# Patient Record
Sex: Male | Born: 1980 | Race: Black or African American | Hispanic: No | Marital: Single | Smoking: Current every day smoker
Health system: Southern US, Community
[De-identification: ages and names within clinical notes are randomized; demographics above are authoritative.]

---

## 2004-03-11 ENCOUNTER — Emergency Department (HOSPITAL_COMMUNITY): Admission: EM | Admit: 2004-03-11 | Discharge: 2004-03-11 | Payer: Self-pay | Admitting: Emergency Medicine

## 2005-08-26 ENCOUNTER — Emergency Department (HOSPITAL_COMMUNITY): Admission: EM | Admit: 2005-08-26 | Discharge: 2005-08-26 | Payer: Self-pay | Admitting: Emergency Medicine

## 2005-09-10 IMAGING — CR DG HAND COMPLETE 3+V*L*
4 series · 4 of 4 positions shown · non-contrast
Comparison: none

CLINICAL DATA: Traumatic amputation, soft tissues distal left thumb.  
 LEFT HAND COMPLETE:
 Four views of the left hand are made and show soft tissue amputation of the tip of the left thumb. There is no evidence of fracture or radiopaque foreign body.  The joint spaces appear normal.

[view not recorded (1 of 4)]
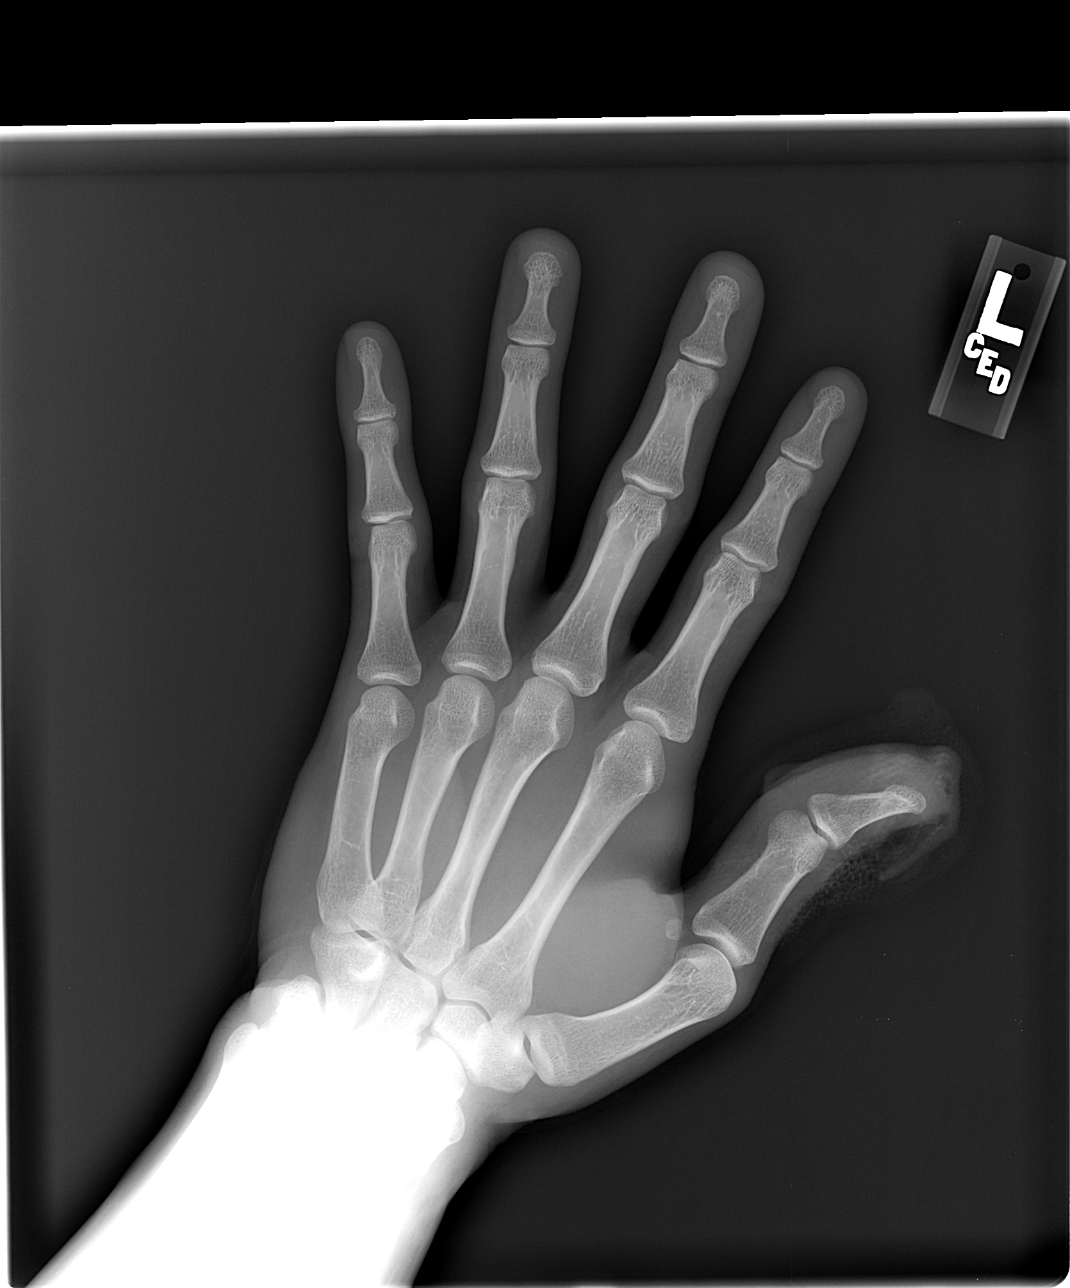

[view not recorded (2 of 4)]
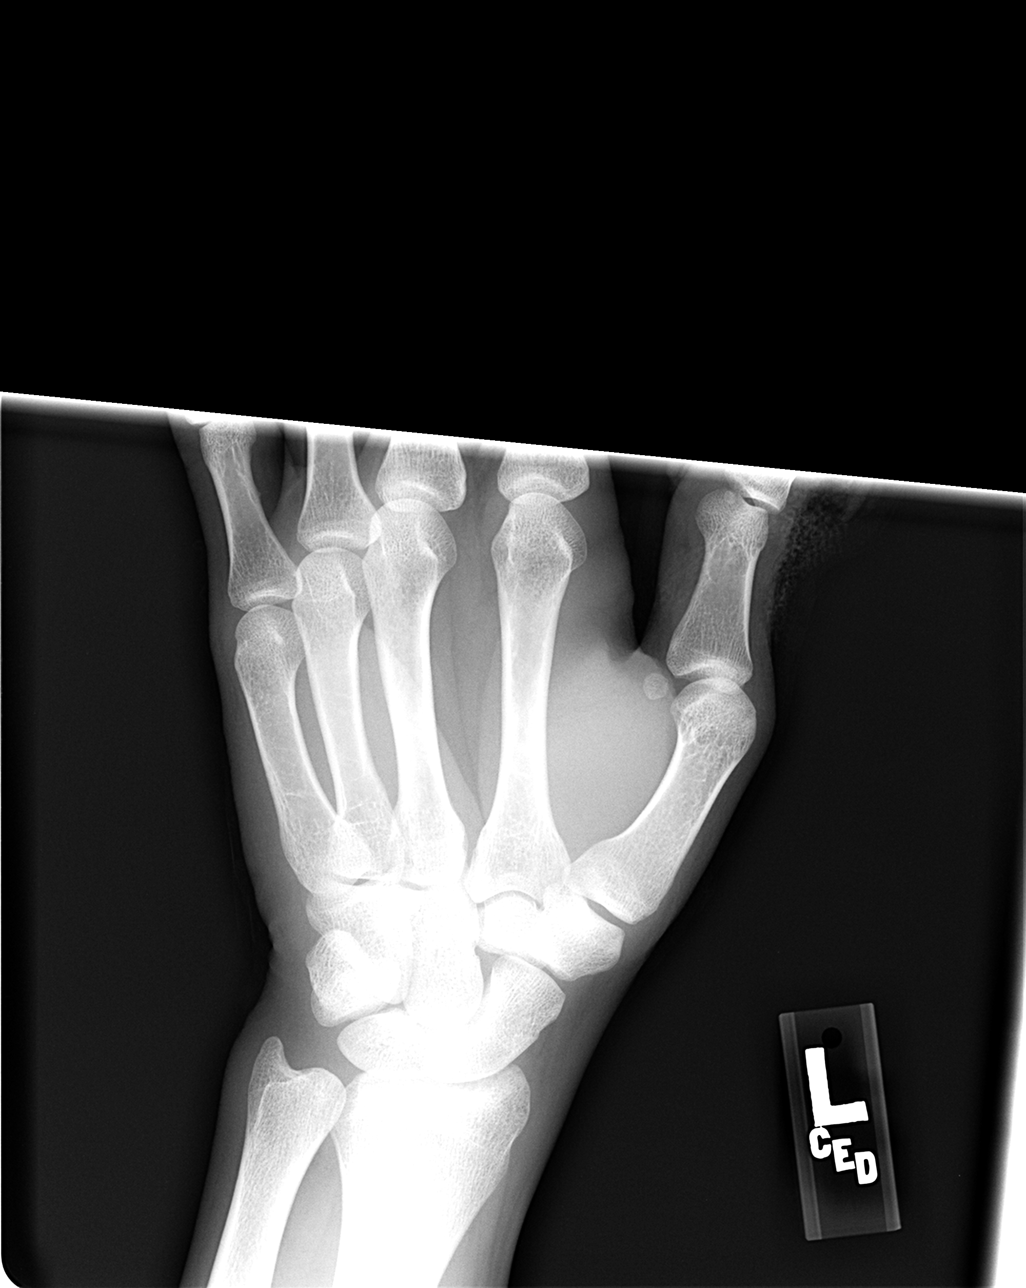

[view not recorded (3 of 4)]
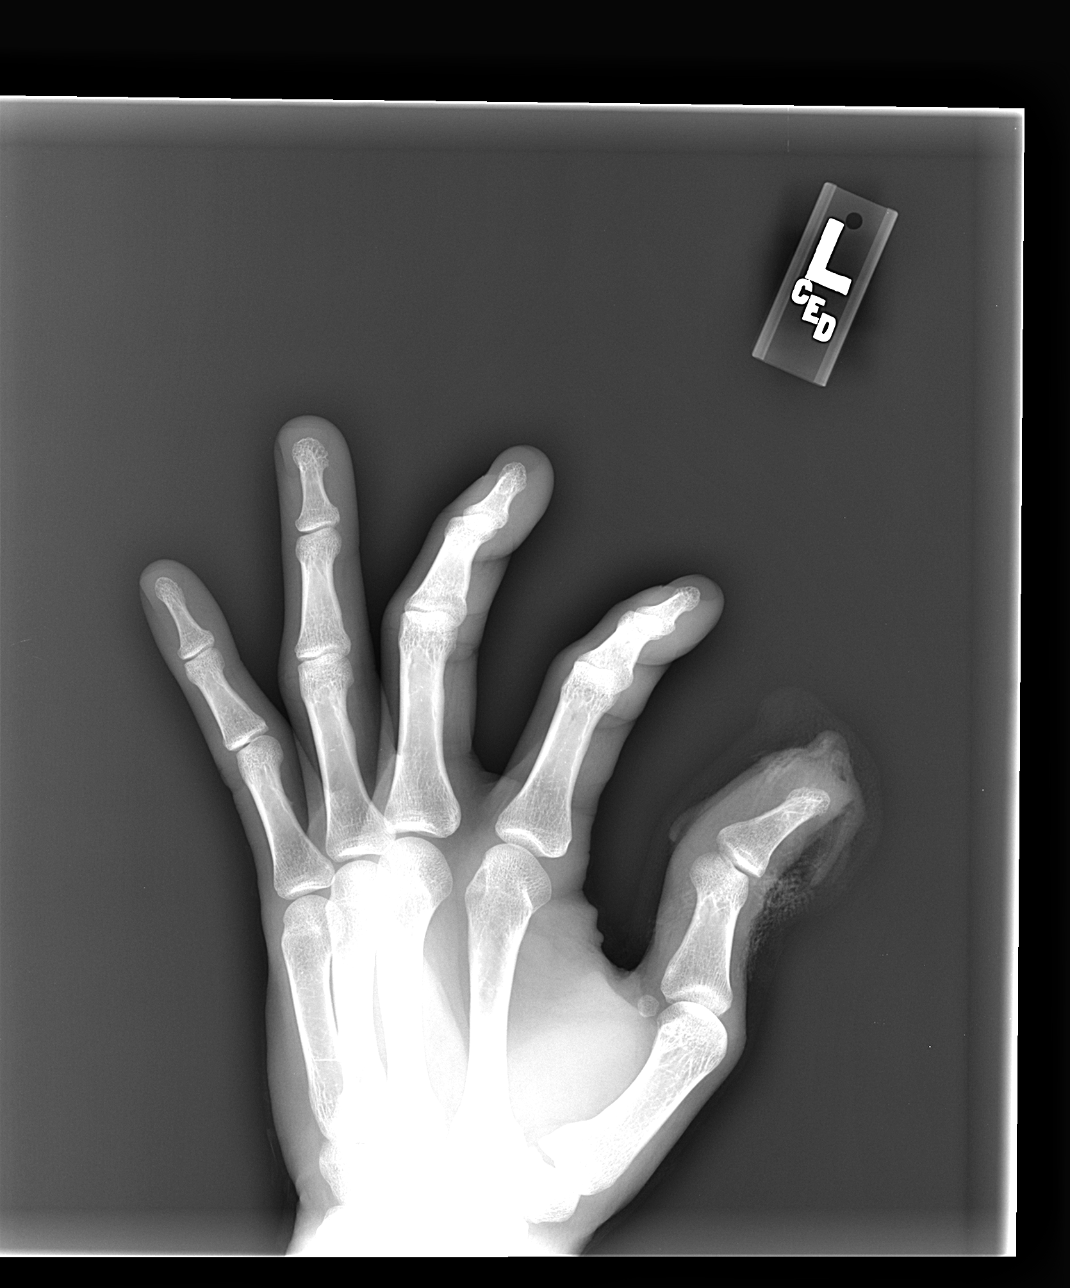

[view not recorded (4 of 4)]
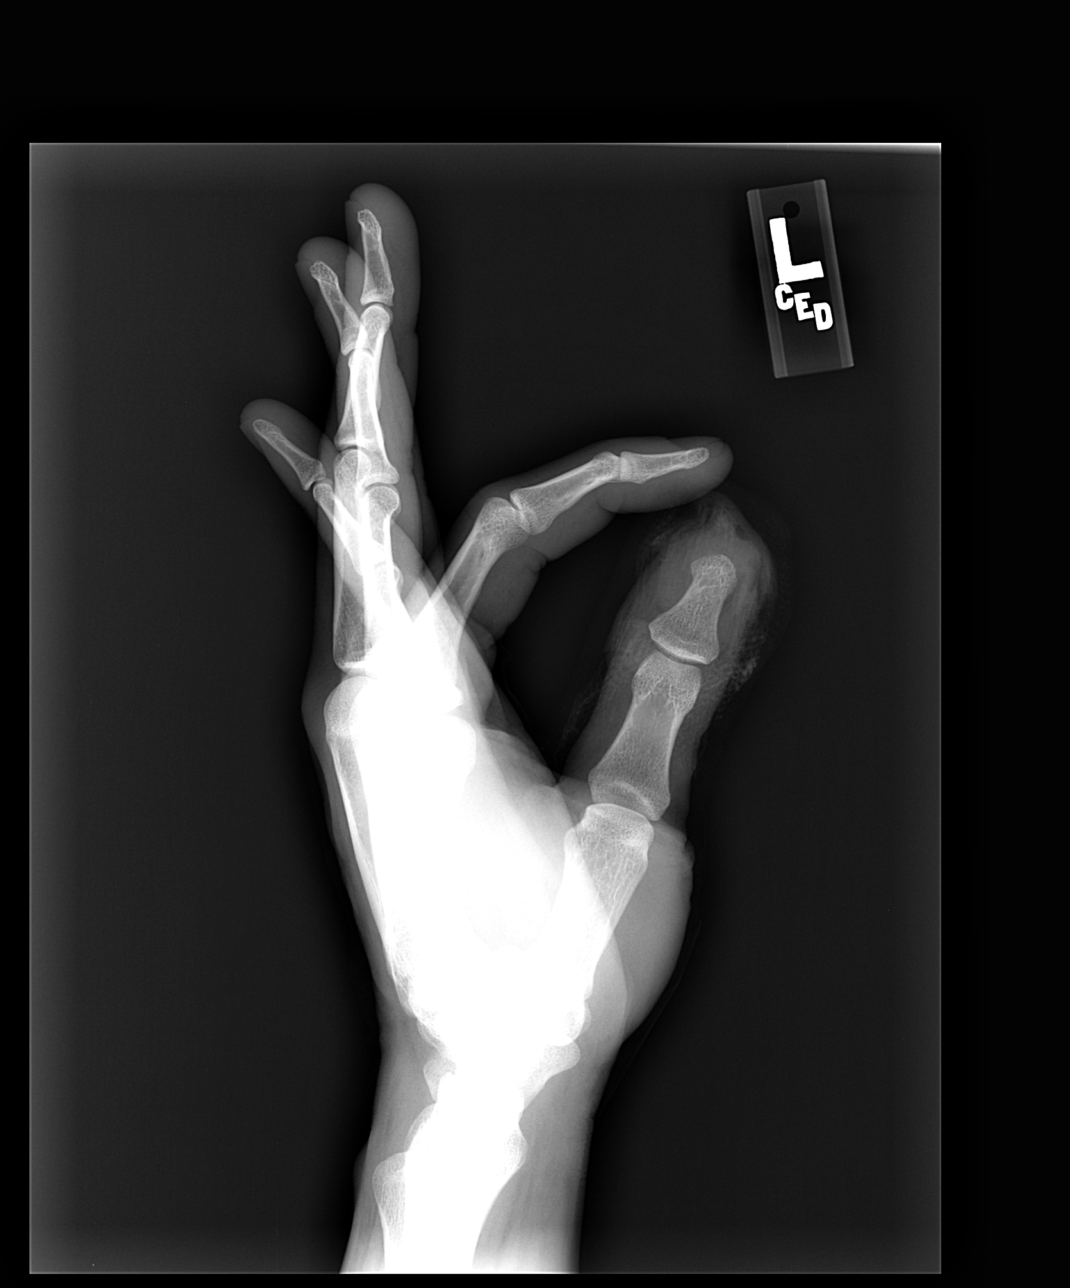

[4 of 4 positions shown; findings below may reference images not displayed]

IMPRESSION: Soft tissue amputation tip left thumb.  No evidence of fracture or radiopaque foreign body. There is a bandage over the left thumb.

## 2006-12-07 ENCOUNTER — Emergency Department (HOSPITAL_COMMUNITY): Admission: EM | Admit: 2006-12-07 | Discharge: 2006-12-07 | Payer: Self-pay | Admitting: Emergency Medicine

## 2008-11-07 ENCOUNTER — Emergency Department (HOSPITAL_COMMUNITY): Admission: EM | Admit: 2008-11-07 | Discharge: 2008-11-07 | Payer: Self-pay | Admitting: Emergency Medicine

## 2012-09-14 ENCOUNTER — Encounter (HOSPITAL_COMMUNITY): Payer: Self-pay | Admitting: Emergency Medicine

## 2012-09-14 ENCOUNTER — Emergency Department (HOSPITAL_COMMUNITY)
Admission: EM | Admit: 2012-09-14 | Discharge: 2012-09-14 | Disposition: A | Discharge status: Home / Self Care | Payer: BC Managed Care – PPO | Attending: Emergency Medicine | Admitting: Emergency Medicine

## 2012-09-14 DIAGNOSIS — F17200 Nicotine dependence, unspecified, uncomplicated: Secondary | ICD-10-CM | POA: Insufficient documentation

## 2012-09-14 DIAGNOSIS — L740 Miliaria rubra: Secondary | ICD-10-CM

## 2012-09-14 DIAGNOSIS — L259 Unspecified contact dermatitis, unspecified cause: Secondary | ICD-10-CM | POA: Insufficient documentation

## 2012-09-14 MED ORDER — TRIAMCINOLONE ACETONIDE 0.5 % EX OINT: TOPICAL_OINTMENT | Freq: Two times a day (BID) | CUTANEOUS | Status: DC

## 2012-09-14 MED ORDER — DEXAMETHASONE SODIUM PHOSPHATE 10 MG/ML IJ SOLN
10.0000 mg | Freq: Once | INTRAMUSCULAR | Status: AC
  Administered 2012-09-14: 10 mg via INTRAMUSCULAR
  Filled 2012-09-14: qty 1

## 2012-09-14 MED ORDER — DIPHENHYDRAMINE HCL 25 MG PO CAPS
25.0000 mg | ORAL_CAPSULE | Freq: Once | ORAL | Status: AC
  Administered 2012-09-14: 25 mg via ORAL
  Filled 2012-09-14: qty 1

## 2012-09-14 NOTE — ED Notes (Signed)
Pt c/o itching rash x 1 week. States has had several times in the past.

## 2012-09-14 NOTE — ED Notes (Signed)
Rash all over x 1 week itchiy  Chest arms legs has had some of this before

## 2012-09-14 NOTE — ED Provider Notes (Signed)
  CSN: [id number]     Arrival date & time 09/14/12  1158 History     First MD Initiated Contact with Patient 09/14/12 1208     Chief Complaint  Patient presents with  . Rash   (Consider location/radiation/quality/duration/timing/severity/associated sxs/prior Treatment) The history is provided by the patient and medical records.   Patient presents to the ED for her diffuse bodily rash x1 week. Patient states he was recently released from prison, hx of similar following prior stent in prison.  Pt states he did ask laundry service what type of detergent they used because he has sensitive skin, staff was unsure.  Since returning home he has used Gain detergent with improvement of the itching.  States after going out into the heat yesterday though, rash worsened.  Pt used topical hydrocortisone cream without relief.  No recent plant or tick exposure.  No fevers, sweats, or chills.  No other contact with environmental allergens or chemicals.  History reviewed. No pertinent past medical history. No past surgical history on file. No family history on file. History  Substance Use Topics  . Smoking status: Current Every Day Smoker  . Smokeless tobacco: Not on file  . Alcohol Use: Yes    Review of Systems  Skin: Positive for rash.  All other systems reviewed and are negative.    Allergies  Review of patient's allergies indicates no known allergies.  Home Medications   Current Outpatient Rx  Name  Route  Sig  Dispense  Refill  . hydrocortisone (CORTIZONE-10) 1 % ointment   Topical   Apply 1 application topically 2 (two) times daily as needed (rash , itching).         Marland Kitchen OVER THE COUNTER MEDICATION   Oral   Take 1 tablet by mouth daily as needed (itching).          BP 128/81  Pulse 108  Temp(Src) 98.4 F (36.9 C)  Resp 16  SpO2 99%  Physical Exam  Nursing note and vitals reviewed. Constitutional: He is oriented to person, place, and time. He appears well-developed and  well-nourished. No distress.  HENT:  Head: Normocephalic and atraumatic.  Mouth/Throat: Oropharynx is clear and moist.  Eyes: Conjunctivae and EOM are normal. Pupils are equal, round, and reactive to light.  Neck: Normal range of motion. Neck supple.  Cardiovascular: Normal rate, regular rhythm and normal heart sounds.   Pulmonary/Chest: Effort normal and breath sounds normal.  Musculoskeletal: Normal range of motion.  Neurological: He is alert and oriented to person, place, and time.  Skin: Skin is warm and dry. Rash noted. No petechiae noted. Rash is papular. He is not diaphoretic.  Diffuse papular rash to BUE and trunk, mildly pruritic; no areas of excoriation; no bug bites identified  Psychiatric: He has a normal mood and affect.    ED Course   Procedures (including critical care time)  Labs Reviewed - No data to display No results found.  1. Contact dermatitis   2. Heat rash     MDM   Rash appears to be mix of contact dermatitis and heat rash.  No identifiable bug bites or petechia.  Decadron and benadryl given.  Rx kenalog.  Instructed he may continue taking OTC benadryl to control itching.  [facility] if sx not resolving in the next few days.  Discussed plan with pt, he agreed.  Return precautions advised.  Garlon Hatchet, PA-C 09/14/12 1300

## 2012-09-14 NOTE — ED Provider Notes (Signed)
Medical screening examination/treatment/procedure(s) were performed by non-physician practitioner and as supervising physician I was immediately available for consultation/collaboration.   Kacyn Souder, MD 09/14/12 1458 

## 2012-09-27 ENCOUNTER — Ambulatory Visit: Payer: BC Managed Care – PPO | Attending: Internal Medicine | Admitting: Internal Medicine

## 2012-09-27 VITALS — BP 130/84 | HR 102 | Temp 98.1°F | Resp 16 | Ht 72.0 in | Wt 247.0 lb

## 2012-09-27 DIAGNOSIS — Z0000 Encounter for general adult medical examination without abnormal findings: Secondary | ICD-10-CM | POA: Insufficient documentation

## 2012-09-27 DIAGNOSIS — R21 Rash and other nonspecific skin eruption: Secondary | ICD-10-CM

## 2012-09-27 NOTE — Progress Notes (Signed)
Pt is here to establish care. Pt. came into day and has expressed that his skin is irritated, discolored and gets worse with heat. These symptoms started 2 years ago. It comes and goes. Some ointments have help but it always seems to come back.

## 2012-09-28 ENCOUNTER — Ambulatory Visit: Payer: BC Managed Care – PPO | Attending: Family Medicine | Admitting: Internal Medicine

## 2012-09-28 VITALS — BP 125/62 | HR 96 | Temp 98.7°F | Resp 18 | Ht 72.0 in | Wt 248.6 lb

## 2012-09-28 DIAGNOSIS — R21 Rash and other nonspecific skin eruption: Secondary | ICD-10-CM | POA: Insufficient documentation

## 2012-09-28 MED ORDER — TRIAMCINOLONE ACETONIDE 0.5 % EX OINT: TOPICAL_OINTMENT | Freq: Two times a day (BID) | CUTANEOUS | Status: DC

## 2012-09-28 NOTE — Progress Notes (Addendum)
Patient Demographics  Billy Ward, is a 32 y.o. male  FAO:[id number]  ONG:[id number]  DOB - Aug 31, 1980  Chief Complaint  Patient presents with  . Rash        Subjective:   Billy Ward today is here to establish primary care.  Currently patient has only complaint is a rash that started on his b/l arms last week, now has involved his torso-back/abdomen/chest. Is exfoliating.Similar rash occurred 2 years back and resolved spontaneously. Denies any new meds, soap, etc. No oral, genital involvement Patient has also has No headache, No chest pain, No abdominal pain,No Nausea, No new weakness tingling or numbness, No Cough or SOB.  Objective:    Filed Vitals:   09/28/12 1302  BP: 125/62  Pulse: 96  Temp: 98.7 F (37.1 C)  Resp: 18  Height: 6' (1.829 m)  Weight: 248 lb 9.6 oz (112.764 kg)  SpO2: 99%   ALLERGIES:  No Known Allergies  PAST MEDICAL HISTORY: No past medical history on file.  PAST SURGICAL HISTORY: No past surgical history on file.  FAMILY HISTORY: No family history of CAD/CVA  MEDICATIONS AT HOME: Prior to Admission medications   Medication Sig Start Date End Date Taking? Authorizing Provider  OVER THE COUNTER MEDICATION Take 1 tablet by mouth daily as needed (itching).   Yes Historical Provider, MD  triamcinolone ointment (KENALOG) 0.5 % Apply topically 2 (two) times daily. 09/28/12  Yes Shanker Levora Dredge, MD  hydrocortisone (CORTIZONE-10) 1 % ointment Apply 1 application topically 2 (two) times daily as needed (rash , itching).    Historical Provider, MD    SOCIAL HISTORY:   reports that he has been smoking.  He does not have any smokeless tobacco history on file. He reports that  drinks alcohol. He reports that he uses illicit drugs (Marijuana) about 5 times per week.  REVIEW OF SYSTEMS:  Constitutional:   No   Fevers, chills, fatigue.  HEENT:    No headaches, Sore throat,   Cardio-vascular: No chest pain,  Orthopnea, swelling in lower  extremities, anasarca, palpitations  GI:  No abdominal pain, nausea, vomiting, diarrhea  Resp: No shortness of breath,  No coughing up of blood.No cough.No wheezing.  Skin:  No mucosal involvement of the rash  GU:  no dysuria, change in color of urine, no urgency or frequency.  No flank pain.  Musculoskeletal: No joint pain or swelling.  No decreased range of motion.  No back pain.  Psych: No change in mood or affect. No depression or anxiety.  No memory loss.   Exam  General appearance :Awake, alert, not in any distress. Speech Clear. Not toxic Looking HEENT: Atraumatic and Normocephalic, pupils equally reactive to light and accomodation Neck: supple, no JVD. No cervical lymphadenopathy.  Chest:Good air entry bilaterally, no added sounds  CVS: S1 S2 regular, no murmurs.  Abdomen: Bowel sounds present, Non tender and not distended with no gaurding, rigidity or rebound. Extremities: B/L Lower Ext shows no edema, both legs are warm to touch Neurology: Awake alert, and oriented X 3, CN II-XII intact, Non focal Skin:exfoliative rash all over the torso-back, chest, abdomen. Also in the hands/arms. No mucosal involvement Wounds:N/A  Data Review   CBC No results found for this basename: WBC, HGB, HCT, PLT, MCV, MCH, MCHC, RDW, NEUTRABS, LYMPHSABS, MONOABS, EOSABS, BASOSABS, BANDABS, BANDSABD,  in the last 168 hours  Chemistries   No results found for this basename: NA, K, CL, CO2, GLUCOSE, BUN, CREATININE, GFRCGP, CALCIUM, MG, AST, ALT, ALKPHOS, BILITOT,  in the last 168 hours ------------------------------------------------------------------------------------------------------------------ No results found for this basename: HGBA1C,  in the last 72 hours ------------------------------------------------------------------------------------------------------------------ No results found for this basename: CHOL, HDL, LDLCALC, TRIG, CHOLHDL, LDLDIRECT,  in the last 72  hours ------------------------------------------------------------------------------------------------------------------ No results found for this basename: TSH, T4TOTAL, FREET3, T3FREE, THYROIDAB,  in the last 72 hours ------------------------------------------------------------------------------------------------------------------ No results found for this basename: VITAMINB12, FOLATE, FERRITIN, TIBC, IRON, RETICCTPCT,  in the last 72 hours  Coagulation profile  No results found for this basename: INR, PROTIME,  in the last 168 hours    Assessment & Plan  Exfoliative Rash -trial of topical triamcinolone  -needs dermatology referral and skin bx  Follow up in 1 week  The patient was given clear instructions to go to ER or return to medical center if symptoms don't improve, worsen or new problems develop. The patient verbalized understanding. The patient was told to call to get lab results if they haven't heard anything in the next week.

## 2012-10-04 NOTE — Progress Notes (Signed)
Subjective:     Patient ID: Billy Ward, male   DOB: [birth year], 32 y.o.   MRN: [id number]  HPI Patient was not seen  Review of Systems  Patient left the clinic before he could be seen    Objective:   Physical Exam     Assessment:     No assessment    Plan:     No plan since patient could not be sent

## 2021-06-16 ENCOUNTER — Emergency Department (HOSPITAL_COMMUNITY)
Admission: EM | Admit: 2021-06-16 | Discharge: 2021-06-17 | Disposition: A | Discharge status: Home / Self Care | Payer: Self-pay | Attending: Student | Admitting: Student

## 2021-06-16 ENCOUNTER — Other Ambulatory Visit: Payer: Self-pay

## 2021-06-16 ENCOUNTER — Encounter (HOSPITAL_COMMUNITY): Payer: Self-pay | Admitting: Emergency Medicine

## 2021-06-16 DIAGNOSIS — R21 Rash and other nonspecific skin eruption: Secondary | ICD-10-CM | POA: Insufficient documentation

## 2021-06-16 DIAGNOSIS — L2089 Other atopic dermatitis: Secondary | ICD-10-CM | POA: Diagnosis not present

## 2021-06-16 DIAGNOSIS — F17210 Nicotine dependence, cigarettes, uncomplicated: Secondary | ICD-10-CM | POA: Diagnosis not present

## 2021-06-16 DIAGNOSIS — L309 Dermatitis, unspecified: Secondary | ICD-10-CM

## 2021-06-16 MED ORDER — TRIAMCINOLONE ACETONIDE 0.1 % EX OINT
TOPICAL_OINTMENT | Freq: Once | CUTANEOUS | Status: DC
  Filled 2021-06-16: qty 15

## 2021-06-16 MED ORDER — TRIAMCINOLONE ACETONIDE 0.1 % EX LOTN: 1.0000 "application " | TOPICAL_LOTION | Freq: Three times a day (TID) | CUTANEOUS | 0 refills | Status: DC

## 2021-06-16 MED ORDER — TRIAMCINOLONE ACETONIDE 0.1 % EX LOTN
TOPICAL_LOTION | Freq: Two times a day (BID) | CUTANEOUS | Status: DC
  Administered 2021-06-16: 1 via TOPICAL
  Filled 2021-06-16: qty 60

## 2021-06-16 NOTE — ED Provider Notes (Signed)
[facility] EMERGENCY DEPARTMENT Provider Note  CSN: [id number] Arrival date & time: 06/16/21 1900  Chief Complaint(s) Eczema  HPI Billy Ward is a 41 y.o. male with PMH eczema who presents emergency department for evaluation of eczema.  He states that he has been using home Eucerin cream without relief.  He states that he has been taking frequent baths but his skin continues to itch.  He does not have a dermatologist.  He denies chest pain, shortness of breath, abdominal pain, nausea, vomiting or other systemic symptoms.   Past Medical History History reviewed. No pertinent past medical history. There are no problems to display for this patient.  Home Medication(s) Prior to Admission medications   Medication Sig Start Date End Date Taking? Authorizing Provider  triamcinolone lotion (KENALOG) 0.1 % Apply 1 application. topically 3 (three) times daily. 06/16/21  Yes Tavi Hoogendoorn, MD  hydrocortisone (CORTIZONE-10) 1 % ointment Apply 1 application topically 2 (two) times daily as needed (rash , itching).    [provider]  OVER THE COUNTER MEDICATION Take 1 tablet by mouth daily as needed (itching).    [provider]  triamcinolone ointment (KENALOG) 0.5 % Apply topically 2 (two) times daily. 09/28/12   Ghimire, Werner Lean, MD                                                                                                                                    Past Surgical History History reviewed. No pertinent surgical history. Family History No family history on file.  Social History Social History   Tobacco Use   Smoking status: Every Day    Packs/day: 1.00    Types: Cigarettes  Substance Use Topics   Alcohol use: Yes   Drug use: Yes    Types: Marijuana, Cocaine   Allergies Patient has no known allergies.  Review of Systems Review of Systems  Skin:  Positive for rash.   Physical Exam Vital Signs  I have reviewed the triage vital  signs BP 116/84   Pulse 86   Temp 98 F (36.7 C) (Oral)   Resp 16   SpO2 99%   Physical Exam Constitutional:      General: He is not in acute distress.    Appearance: Normal appearance.  HENT:     Head: Normocephalic and atraumatic.     Nose: No congestion or rhinorrhea.  Eyes:     General:        Right eye: No discharge.        Left eye: No discharge.     Extraocular Movements: Extraocular movements intact.     Pupils: Pupils are equal, round, and reactive to light.  Cardiovascular:     Rate and Rhythm: Normal rate and regular rhythm.     Heart sounds: No murmur heard. Pulmonary:     Effort: No respiratory distress.     Breath sounds: No wheezing or  rales.  Abdominal:     General: There is no distension.     Tenderness: There is no abdominal tenderness.  Musculoskeletal:        General: Normal range of motion.     Cervical back: Normal range of motion.  Skin:    General: Skin is warm and dry.     Findings: Rash present.  Neurological:     General: No focal deficit present.     Mental Status: He is alert.    ED Results and Treatments Labs (all labs ordered are listed, but only abnormal results are displayed) Labs Reviewed - No data to display                                                                                                                        Radiology No results found.  Pertinent labs & imaging results that were available during my care of the patient were reviewed by me and considered in my medical decision making (see MDM for details).  Medications Ordered in ED Medications  triamcinolone lotion (KENALOG) 0.1 % (1 application. Topical Given 06/16/21 2341)                                                                                                                                     Procedures Procedures  (including critical care time)  Medical Decision Making / ED Course   This patient presents to the ED for concern of rash, this  involves an extensive number of treatment options, and is a complaint that carries with it a high risk of complications and morbidity.  The differential diagnosis includes eczema, contact dermatitis, dry skin  MDM: Seen emergency room for evaluation of eczema.  Physical exam reveals an eczematous rash over the entire trunk, back and to the back of the neck.  Presentation consistent with eczema and education was provided about avoiding frequent showers and the need to keep the skin hydrated.  Kenalog lotion applied to the affected area and patient discharged with a additional prescription for this.  He was given resources to establish outpatient with dermatology and was discharged.   Additional history obtained:  -External records from outside source obtained and reviewed including: Chart review including previous notes, labs, imaging, consultation notes    Medicines ordered and prescription drug management: Meds ordered this encounter  Medications   DISCONTD: triamcinolone ointment (KENALOG) 0.1 %  triamcinolone lotion (KENALOG) 0.1 %   triamcinolone lotion (KENALOG) 0.1 %    Sig: Apply 1 application. topically 3 (three) times daily.    Dispense:  60 mL    Refill:  0    -I have reviewed the patients home medicines and have made adjustments as needed  Critical interventions none    Cardiac Monitoring: The patient was maintained on a cardiac monitor.  I personally viewed and interpreted the cardiac monitored which showed an underlying rhythm of: NSR  Social Determinants of Health:  Factors impacting patients care include: No current dermatologist    Reevaluation: After the interventions noted above, I reevaluated the patient and found that they have :improved  Co morbidities that complicate the patient evaluation History reviewed. No pertinent past medical history.    Dispostion: I considered admission for this patient, but patient's eczema does not meet inpatient criteria  for admission and he is safe for discharge with outpatient follow-up     Final Clinical Impression(s) / ED Diagnoses Final diagnoses:  Eczema, unspecified type     @PCDICTATION @    , MD 06/17/21 [phone]

## 2021-06-16 NOTE — ED Triage Notes (Addendum)
Patient reports worsening eczema with dry skin , rashes and drainage at arms and back for several days unrelived by OTC lotion .

## 2021-06-16 NOTE — ED Provider Triage Note (Signed)
Emergency Medicine Provider Triage Evaluation Note  Billy Ward , a 41 y.o. male  was evaluated in triage.  Pt complains of worsening rash to his back.  History of eczema and feels that this has been flaring up.  Has been using over-the-counter Aquaphor without much improvement.  He feels that the area is "weeping."  States that his eczema has never been this bad in the past.  Review of Systems  Positive: Rash Negative: Fever  Physical Exam  BP (!) 118/94 (BP Location: Right Arm)   Pulse 97   Temp 98.3 F (36.8 C) (Oral)   Resp 20   SpO2 100%  Gen:   Awake, no distress   Resp:  Normal effort  MSK:   Moves extremities without difficulty  Other:  Scaly skin noted to extremities  Medical Decision Making  Medically screening exam initiated at 8:40 PM.  Appropriate orders placed.  Chauncy Mangiaracina was informed that the remainder of the evaluation will be completed by another provider, this initial triage assessment does not replace that evaluation, and the importance of remaining in the ED until their evaluation is complete.  Medication management   Dietrich Pates, Cordelia Poche 06/16/21 2041

## 2022-05-15 ENCOUNTER — Other Ambulatory Visit: Payer: Self-pay

## 2022-05-15 ENCOUNTER — Emergency Department (HOSPITAL_COMMUNITY)
Admission: EM | Admit: 2022-05-15 | Discharge: 2022-05-15 | Discharge status: Against Medical Advice | Payer: BC Managed Care – PPO | Attending: Emergency Medicine | Admitting: Emergency Medicine

## 2022-05-15 DIAGNOSIS — T7840XA Allergy, unspecified, initial encounter: Secondary | ICD-10-CM | POA: Diagnosis not present

## 2022-05-15 DIAGNOSIS — R21 Rash and other nonspecific skin eruption: Secondary | ICD-10-CM | POA: Diagnosis present

## 2022-05-15 DIAGNOSIS — Z5321 Procedure and treatment not carried out due to patient leaving prior to being seen by health care provider: Secondary | ICD-10-CM | POA: Insufficient documentation

## 2022-05-15 NOTE — ED Triage Notes (Signed)
Pt. Stated, I have a whelps and rash on my arms and stomach for 2 days.

## 2022-05-15 NOTE — ED Provider Triage Note (Signed)
Emergency Medicine Provider Triage Evaluation Note  Billy Ward , a 42 y.o. male  was evaluated in triage.  Pt complains of allergic reaction.  He has rash to his face, arms, and torso.  Denies difficulty breathing, swallowing, vision changes, eye swelling.  Denies known allergies.  Tried Benadryl, helped "a little bit" but not significant.    Review of Systems  Positive: As above Negative: As above  Physical Exam  There were no vitals taken for this visit. Gen:   Awake, no distress   Resp:  Normal effort  MSK:   Moves extremities without difficulty  Other:  Papular rash to bilateral arms, torso, and face.   Medical Decision Making  Medically screening exam initiated at 9:59 AM.  Appropriate orders placed.  Billy Ward was informed that the remainder of the evaluation will be completed by another provider, this initial triage assessment does not replace that evaluation, and the importance of remaining in the ED until their evaluation is complete.     Melton Alar R, PA-C 05/15/22 1003

## 2022-05-29 ENCOUNTER — Emergency Department (HOSPITAL_COMMUNITY)
Admission: EM | Admit: 2022-05-29 | Discharge: 2022-05-29 | Disposition: A | Discharge status: Home / Self Care | Payer: BC Managed Care – PPO | Attending: Emergency Medicine | Admitting: Emergency Medicine

## 2022-05-29 ENCOUNTER — Other Ambulatory Visit: Payer: Self-pay

## 2022-05-29 ENCOUNTER — Encounter (HOSPITAL_COMMUNITY): Payer: Self-pay

## 2022-05-29 DIAGNOSIS — H9201 Otalgia, right ear: Secondary | ICD-10-CM | POA: Diagnosis present

## 2022-05-29 DIAGNOSIS — L309 Dermatitis, unspecified: Secondary | ICD-10-CM | POA: Diagnosis not present

## 2022-05-29 MED ORDER — TRIAMCINOLONE ACETONIDE 0.1 % EX CREA: 1.0000 | TOPICAL_CREAM | Freq: Three times a day (TID) | CUTANEOUS | 0 refills | Status: DC

## 2022-05-29 MED ORDER — TRIAMCINOLONE ACETONIDE 0.1 % EX CREA
TOPICAL_CREAM | Freq: Two times a day (BID) | CUTANEOUS | Status: DC
  Filled 2022-05-29: qty 15

## 2022-05-29 NOTE — ED Triage Notes (Signed)
Pt arrived POV w/ c/o R ear pain and decreased hearing to R ear x2 days. Pt states pain is 6/10 when pressure is applied or he is lying down on it. Has taken aspirin for pain with no relief. Pt in NAD

## 2022-05-29 NOTE — ED Notes (Signed)
Reviewed discharge instructions with patient. Follow-up care and medications reviewed. Patient  verbalized understanding. Patient A&Ox4, VSS, and ambulatory with steady gait upon discharge.  °

## 2022-05-29 NOTE — ED Provider Notes (Signed)
[place] EMERGENCY DEPARTMENT AT [facility] Provider Note   CSN: [id number] Arrival date & time: 05/29/22  4098     History  Chief Complaint  Patient presents with   Otalgia    Billy Ward is a 42 y.o. male.  HPI    42 year old male comes in with chief complaint of right ear pain.  Although his primary complaint is ear pain, he wants me to focus on his upper extremity rash.  Patient indicates that for the last 2 days he has been noticing right ear pain.  He thinks that the pain is external.  It is worse with him laying on it.  He also suspects some hearing loss.  He is also having dry skin and itchiness over his bilateral upper extremity and little bit over his torso.  He states that he normally requires an ointment.  He has been applying over-the-counter moisturizing lotion without significant relief.  Home Medications Prior to Admission medications   Medication Sig Start Date End Date Taking? Authorizing Provider  triamcinolone cream (KENALOG) 0.1 % Apply 1 Application topically 3 (three) times daily for 7 days. Use it for 7 to 10 days, until symptoms improve. Do not use it beyond 10 days 05/29/22 06/05/22 Yes Ilijah Doucet, Janey Genta, MD  hydrocortisone (CORTIZONE-10) 1 % ointment Apply 1 application topically 2 (two) times daily as needed (rash , itching).    [provider]  OVER THE COUNTER MEDICATION Take 1 tablet by mouth daily as needed (itching).    [provider]      Allergies    Patient has no known allergies.    Review of Systems   Review of Systems  All other systems reviewed and are negative.   Physical Exam Updated Vital Signs BP 103/62   Pulse 100   Temp 98 F (36.7 C) (Oral)   Resp 18   Ht 5\' 9"  (1.753 m)   Wt 104.3 kg   SpO2 100%   BMI 33.97 kg/m  Physical Exam Vitals and nursing note reviewed.  Constitutional:      Appearance: He is well-developed.  HENT:     Head: Atraumatic.     Right Ear: Tympanic membrane, ear  canal and external ear normal.     Left Ear: Tympanic membrane, ear canal and external ear normal.  Cardiovascular:     Rate and Rhythm: Normal rate.  Pulmonary:     Effort: Pulmonary effort is normal.  Musculoskeletal:     Cervical back: Neck supple.  Skin:    General: Skin is warm.     Comments: Dry, scaly skin noted over bilateral upper extremity and also the abdominal wall  Neurological:     Mental Status: He is alert and oriented to person, place, and time.     ED Results / Procedures / Treatments   Labs (all labs ordered are listed, but only abnormal results are displayed) Labs Reviewed - No data to display  EKG None  Radiology No results found.  Procedures Procedures    Medications Ordered in ED Medications  triamcinolone cream (KENALOG) 0.1 % cream ( Topical Given 05/29/22 1017)    ED Course/ Medical Decision Making/ A&P                             Medical Decision Making Risk Prescription drug management.  42 year old male comes in with chief complaint of ear ache.  Currently he is pain-free.  He does  indicate some hearing loss.  Patient's ear exam is reassuring.  TM looks intact and there is no evidence of debris in the ear canal, no signs of AOM and patient does not have infection of the external ear.  Given the hearing loss, we will advise that he follows up with ENT.  Patient also complains of skin changes.  In the past he has required Kenalog for eczema.  Appears that he is having flareup again.  We will prescribe Kenalog.  I have reviewed patient's previous prescriptions and workup.  Final Clinical Impression(s) / ED Diagnoses Final diagnoses:  Eczema, unspecified type  Right ear pain    Rx / DC Orders ED Discharge Orders          Ordered    triamcinolone cream (KENALOG) 0.1 %  3 times daily        05/29/22 1007              Derwood Kaplan, MD 05/29/22 1024

## 2022-05-29 NOTE — ED Notes (Signed)
Pt requesting more cream and abx shot and asking if he checks back in if he can get more cream.

## 2022-05-29 NOTE — ED Notes (Signed)
Notified pharmacy to send kenalog cream stat d/t pt pending DC

## 2022-05-29 NOTE — Discharge Instructions (Addendum)
Take the eczema cream that is prescribed.  Use it 3 times a day as needed for eczema.  Do not use the cream for more than 10 days.  [facility] at [facility] number provided to ensure that you get optimal care.  We do not see anything abnormal with your ears.  Given that you are complaining of hearing loss, we recommend that you see the ENT doctors for additional audiological evaluation.

## 2022-06-05 ENCOUNTER — Inpatient Hospital Stay (HOSPITAL_COMMUNITY)
Admission: EM | Admit: 2022-06-05 | Discharge: 2022-06-17 | DRG: 872 | Disposition: A | Discharge status: Home / Self Care | Payer: BC Managed Care – PPO | Attending: Internal Medicine | Admitting: Internal Medicine

## 2022-06-05 ENCOUNTER — Emergency Department (HOSPITAL_COMMUNITY): Payer: BC Managed Care – PPO

## 2022-06-05 DIAGNOSIS — R509 Fever, unspecified: Secondary | ICD-10-CM | POA: Diagnosis present

## 2022-06-05 DIAGNOSIS — L03116 Cellulitis of left lower limb: Secondary | ICD-10-CM | POA: Diagnosis present

## 2022-06-05 DIAGNOSIS — L309 Dermatitis, unspecified: Secondary | ICD-10-CM | POA: Insufficient documentation

## 2022-06-05 DIAGNOSIS — H9201 Otalgia, right ear: Secondary | ICD-10-CM | POA: Diagnosis present

## 2022-06-05 DIAGNOSIS — F17210 Nicotine dependence, cigarettes, uncomplicated: Secondary | ICD-10-CM | POA: Diagnosis present

## 2022-06-05 DIAGNOSIS — R7881 Bacteremia: Secondary | ICD-10-CM | POA: Diagnosis not present

## 2022-06-05 DIAGNOSIS — F1410 Cocaine abuse, uncomplicated: Secondary | ICD-10-CM | POA: Diagnosis present

## 2022-06-05 DIAGNOSIS — Z5901 Sheltered homelessness: Secondary | ICD-10-CM

## 2022-06-05 DIAGNOSIS — Z1152 Encounter for screening for COVID-19: Secondary | ICD-10-CM

## 2022-06-05 DIAGNOSIS — F1010 Alcohol abuse, uncomplicated: Secondary | ICD-10-CM | POA: Diagnosis present

## 2022-06-05 DIAGNOSIS — R799 Abnormal finding of blood chemistry, unspecified: Secondary | ICD-10-CM | POA: Insufficient documentation

## 2022-06-05 DIAGNOSIS — R5381 Other malaise: Secondary | ICD-10-CM | POA: Diagnosis present

## 2022-06-05 DIAGNOSIS — A419 Sepsis, unspecified organism: Secondary | ICD-10-CM

## 2022-06-05 DIAGNOSIS — L03119 Cellulitis of unspecified part of limb: Principal | ICD-10-CM

## 2022-06-05 DIAGNOSIS — B9561 Methicillin susceptible Staphylococcus aureus infection as the cause of diseases classified elsewhere: Secondary | ICD-10-CM | POA: Insufficient documentation

## 2022-06-05 DIAGNOSIS — M7989 Other specified soft tissue disorders: Secondary | ICD-10-CM | POA: Diagnosis present

## 2022-06-05 DIAGNOSIS — L03115 Cellulitis of right lower limb: Secondary | ICD-10-CM | POA: Diagnosis present

## 2022-06-05 DIAGNOSIS — N179 Acute kidney failure, unspecified: Secondary | ICD-10-CM

## 2022-06-05 LAB — COMPREHENSIVE METABOLIC PANEL
ALT: 15 U/L (ref 0–44)
AST: 26 U/L (ref 15–41)
Albumin: 2.1 g/dL — ABNORMAL LOW (ref 3.5–5.0)
Alkaline Phosphatase: 54 U/L (ref 38–126)
Anion gap: 9 (ref 5–15)
BUN: 10 mg/dL (ref 6–20)
CO2: 24 mmol/L (ref 22–32)
Calcium: 7.9 mg/dL — ABNORMAL LOW (ref 8.9–10.3)
Chloride: 103 mmol/L (ref 98–111)
Creatinine, Ser: 1.34 mg/dL — ABNORMAL HIGH (ref 0.61–1.24)
GFR, Estimated: >60 mL/min (ref >60)
Glucose, Bld: 107 mg/dL — ABNORMAL HIGH (ref 70–99)
Potassium: 3.9 mmol/L (ref 3.5–5.1)
Sodium: 136 mmol/L (ref 135–145)
Total Bilirubin: 0.5 mg/dL (ref 0.3–1.2)
Total Protein: 5.5 g/dL — ABNORMAL LOW (ref 6.5–8.1)

## 2022-06-05 LAB — CBC
HCT: 41.5 % (ref 39.0–52.0)
Hemoglobin: 12.5 g/dL — ABNORMAL LOW (ref 13.0–17.0)
MCH: 27.2 pg (ref 26.0–34.0)
MCHC: 30.1 g/dL (ref 30.0–36.0)
MCV: 90.2 fL (ref 80.0–100.0)
Platelets: 367 10*3/uL (ref 150–400)
RBC: 4.6 MIL/uL (ref 4.22–5.81)
RDW: 14.6 % (ref 11.5–15.5)
WBC: 9.7 10*3/uL (ref 4.0–10.5)
nRBC: 0 % (ref 0.0–0.2)

## 2022-06-05 LAB — RESP PANEL BY RT-PCR (RSV, FLU A&B, COVID)  RVPGX2
Influenza A by PCR: NEGATIVE
Influenza B by PCR: NEGATIVE
Resp Syncytial Virus by PCR: NEGATIVE
SARS Coronavirus 2 by RT PCR: NEGATIVE

## 2022-06-05 LAB — BRAIN NATRIURETIC PEPTIDE: B Natriuretic Peptide: 57 pg/mL (ref 0.0–100.0)

## 2022-06-05 LAB — LACTIC ACID, PLASMA: Lactic Acid, Venous: 1.3 mmol/L (ref 0.5–1.9)

## 2022-06-05 LAB — SARS CORONAVIRUS 2 BY RT PCR: SARS Coronavirus 2 by RT PCR: NEGATIVE

## 2022-06-05 LAB — ETHANOL: Alcohol, Ethyl (B): <10 mg/dL (ref <10)

## 2022-06-05 MED ORDER — LACTATED RINGERS IV BOLUS
1000.0000 mL | Freq: Once | INTRAVENOUS | Status: AC
  Administered 2022-06-05: 1000 mL via INTRAVENOUS

## 2022-06-05 MED ORDER — SODIUM CHLORIDE 0.9 % IV SOLN
2.0000 g | INTRAVENOUS | Status: DC
  Administered 2022-06-05: 2 g via INTRAVENOUS
  Filled 2022-06-05: qty 20

## 2022-06-05 MED ORDER — ACETAMINOPHEN 500 MG PO TABS
1000.0000 mg | ORAL_TABLET | ORAL | Status: AC
  Administered 2022-06-05: 1000 mg via ORAL
  Filled 2022-06-05: qty 2

## 2022-06-05 NOTE — ED Triage Notes (Addendum)
Pt arrives via POV. Pt has been staying at the [facility]. PT reports chills, cough, body aches, and leg swelling for the past two days. Pt states his eczema has worsened as well. Redness, swelling and warmth noted to bilateral legs.

## 2022-06-05 NOTE — ED Provider Notes (Signed)
[place] EMERGENCY DEPARTMENT AT [facility] Provider Note   CSN: [id number] Arrival date & time: 06/05/22  1806     History {Add pertinent medical, surgical, social history, OB history to HPI:1} Chief Complaint  Patient presents with   Chills   Generalized Body Aches   Leg Swelling    Billy Ward is a 42 y.o. male.  42 year old male with a history of eczema, alcohol use, cocaine use who presents emergency department with fevers.  Patient reports that over the past 6 days has been feeling ill.  Says that he has been having sweats and chills.  Says that he also has been having a diffuse rash which is worsened.  Denies any areas of significant pain.  Not itchy at this time.  Says that it is weeping constantly.  Also has had a mild cough.  Thinks he may be sick from a cigarette that he shared with someone.  Denies any nausea, vomiting, diarrhea, or dysuria.  Also reports several days of worsening leg swelling.  No history of IV drug use.  Has been several days since he last used cocaine.  Says that he drinks 224 ounce beers per day no history of alcohol withdrawals.       Home Medications Prior to Admission medications   Medication Sig Start Date End Date Taking? Authorizing Provider  hydrocortisone (CORTIZONE-10) 1 % ointment Apply 1 application topically 2 (two) times daily as needed (rash , itching).    [provider]  OVER THE COUNTER MEDICATION Take 1 tablet by mouth daily as needed (itching).    [provider]  triamcinolone cream (KENALOG) 0.1 % Apply 1 Application topically 3 (three) times daily for 7 days. Use it for 7 to 10 days, until symptoms improve. Do not use it beyond 10 days 05/29/22 06/05/22  Derwood Kaplan, MD      Allergies    Patient has no known allergies.    Review of Systems   Review of Systems  Physical Exam Updated Vital Signs BP (!) 115/92   Pulse (!) 118   Temp (!) 100.4 F (38 C)   Resp 20   Ht 5\' 9"  (1.753 m)    Wt 104.3 kg   BMI 33.97 kg/m  Physical Exam Vitals and nursing note reviewed.  Constitutional:      General: He is not in acute distress.    Appearance: He is well-developed.  HENT:     Head: Normocephalic and atraumatic.     Right Ear: External ear normal.     Left Ear: External ear normal.     Nose: Nose normal.  Eyes:     Extraocular Movements: Extraocular movements intact.     Conjunctiva/sclera: Conjunctivae normal.     Pupils: Pupils are equal, round, and reactive to light.  Cardiovascular:     Rate and Rhythm: Regular rhythm. Tachycardia present.     Heart sounds: Normal heart sounds.  Pulmonary:     Effort: Pulmonary effort is normal. No respiratory distress.     Breath sounds: Normal breath sounds.  Musculoskeletal:     Cervical back: Normal range of motion and neck supple.     Right lower leg: Edema present.     Left lower leg: Edema present.  Skin:    General: Skin is warm and dry.  Neurological:     Mental Status: He is alert. Mental status is at baseline.  Psychiatric:        Mood and Affect: Mood normal.  Behavior: Behavior normal.     ED Results / Procedures / Treatments   Labs (all labs ordered are listed, but only abnormal results are displayed) Labs Reviewed  CBC - Abnormal; Notable for the following components:      Result Value   Hemoglobin 12.5 (*)    All other components within normal limits  COMPREHENSIVE METABOLIC PANEL - Abnormal; Notable for the following components:   Glucose, Bld 107 (*)    Creatinine, Ser 1.34 (*)    Calcium 7.9 (*)    Total Protein 5.5 (*)    Albumin 2.1 (*)    All other components within normal limits  SARS CORONAVIRUS 2 BY RT PCR    EKG None  Radiology DG Chest 2 View  Result Date: 06/05/2022 CLINICAL DATA:  cough, fever EXAM: CHEST - 2 VIEW COMPARISON:  None Available. FINDINGS: The heart and mediastinal contours are within normal limits. Vague airspace opacity along the mid right lower lung zone may  be related to a nipple shadow. No pulmonary edema. No pleural effusion. No pneumothorax. No acute osseous abnormality. IMPRESSION: Vague airspace opacity along the mid right lower lung zone may be related to a nipple shadow. Recommend repeat frontal view with nipple markers. Electronically Signed   By: Tish Frederickson M.D.   On: 06/05/2022 19:57    Procedures Procedures  {Document cardiac monitor, telemetry assessment procedure when appropriate:1}  Medications Ordered in ED Medications - No data to display  ED Course/ Medical Decision Making/ A&P   {   Click here for ABCD2, HEART and other calculatorsREFRESH Note before signing :1}                          Medical Decision Making Amount and/or Complexity of Data Reviewed Labs: ordered.  Risk OTC drugs.   ***  {Document critical care time when appropriate:1} {Document review of labs and clinical decision tools ie heart score, Chads2Vasc2 etc:1}  {Document your independent review of radiology images, and any outside records:1} {Document your discussion with family members, caretakers, and with consultants:1} {Document social determinants of health affecting pt's care:1} {Document your decision making why or why not admission, treatments were needed:1} Final Clinical Impression(s) / ED Diagnoses Final diagnoses:  None    Rx / DC Orders ED Discharge Orders     None

## 2022-06-05 NOTE — ED Provider Triage Note (Signed)
Emergency Medicine Provider Triage Evaluation Note  Billy Ward , a 42 y.o. male  was evaluated in triage.  Pt complains of cough and bilateral leg swelling. Patient reports that he is staying at a group home and has had recent sick contact. He has had cough and shortness of breath several days ago and since developed night sweats and chills. He reports sleeping on the floor at night and believes this has triggered an eczema flare, which is present on both his arms and legs. Additionally, patient developed bilateral lower extremity swelling 2 days ago. He has never experienced anything like this before.   Review of Systems  Positive: Sweating, chills, cough, shortness of breath, eczema on upper and lower extremities,  lower leg swelling Negative:   Physical Exam  BP (!) 115/92   Pulse (!) 118   Temp (!) 100.4 F (38 C)   Resp 20   Ht 5\' 9"  (1.753 m)   Wt 104.3 kg   BMI 33.97 kg/m  Gen:   Awake, no distress, diaphoretic  Resp:  Normal effort  MSK:   Bilateral lower extremity edema with tenderness to palpation. Moves extremities without difficulty Other:  Atopic dermatitis present on upper and lower extremities, bilateral lower extremity edema  Medical Decision Making  Medically screening exam initiated at 6:59 PM.  Appropriate orders placed.  Billy Ward was informed that the remainder of the evaluation will be completed by another provider, this initial triage assessment does not replace that evaluation, and the importance of remaining in the ED until their evaluation is complete.     Maxwell Marion, PA-C 06/05/22 1920

## 2022-06-06 ENCOUNTER — Other Ambulatory Visit: Payer: Self-pay

## 2022-06-06 ENCOUNTER — Encounter (HOSPITAL_COMMUNITY): Payer: Self-pay | Admitting: Internal Medicine

## 2022-06-06 DIAGNOSIS — L309 Dermatitis, unspecified: Secondary | ICD-10-CM | POA: Insufficient documentation

## 2022-06-06 DIAGNOSIS — B9561 Methicillin susceptible Staphylococcus aureus infection as the cause of diseases classified elsewhere: Secondary | ICD-10-CM | POA: Insufficient documentation

## 2022-06-06 DIAGNOSIS — R7881 Bacteremia: Secondary | ICD-10-CM | POA: Insufficient documentation

## 2022-06-06 DIAGNOSIS — R509 Fever, unspecified: Secondary | ICD-10-CM | POA: Diagnosis present

## 2022-06-06 DIAGNOSIS — N179 Acute kidney failure, unspecified: Secondary | ICD-10-CM

## 2022-06-06 DIAGNOSIS — R799 Abnormal finding of blood chemistry, unspecified: Secondary | ICD-10-CM | POA: Insufficient documentation

## 2022-06-06 LAB — BLOOD CULTURE ID PANEL (REFLEXED) - BCID2

## 2022-06-06 LAB — CBC WITH DIFFERENTIAL/PLATELET
Abs Immature Granulocytes: 0.04 10*3/uL (ref 0.00–0.07)
Basophils Absolute: 0.1 10*3/uL (ref 0.0–0.1)
Basophils Relative: 1 %
Eosinophils Absolute: 1.6 10*3/uL — ABNORMAL HIGH (ref 0.0–0.5)
Eosinophils Relative: 20 %
HCT: 38.5 % — ABNORMAL LOW (ref 39.0–52.0)
Hemoglobin: 11.5 g/dL — ABNORMAL LOW (ref 13.0–17.0)
Immature Granulocytes: 1 %
Lymphocytes Relative: 15 %
Lymphs Abs: 1.2 10*3/uL (ref 0.7–4.0)
MCH: 27.4 pg (ref 26.0–34.0)
MCHC: 29.9 g/dL — ABNORMAL LOW (ref 30.0–36.0)
MCV: 91.9 fL (ref 80.0–100.0)
Monocytes Absolute: 1.8 10*3/uL — ABNORMAL HIGH (ref 0.1–1.0)
Monocytes Relative: 22 %
Neutro Abs: 3.5 10*3/uL (ref 1.7–7.7)
Neutrophils Relative %: 41 %
Platelets: 327 10*3/uL (ref 150–400)
RBC: 4.19 MIL/uL — ABNORMAL LOW (ref 4.22–5.81)
RDW: 14.6 % (ref 11.5–15.5)
WBC: 8.2 10*3/uL (ref 4.0–10.5)
nRBC: 0 % (ref 0.0–0.2)

## 2022-06-06 LAB — PROCALCITONIN: Procalcitonin: <0.1 ng/mL

## 2022-06-06 LAB — LACTIC ACID, PLASMA: Lactic Acid, Venous: 1.3 mmol/L (ref 0.5–1.9)

## 2022-06-06 LAB — BASIC METABOLIC PANEL
Anion gap: 9 (ref 5–15)
BUN: 8 mg/dL (ref 6–20)
CO2: 27 mmol/L (ref 22–32)
Calcium: 7.9 mg/dL — ABNORMAL LOW (ref 8.9–10.3)
Chloride: 102 mmol/L (ref 98–111)
Creatinine, Ser: 1.17 mg/dL (ref 0.61–1.24)
GFR, Estimated: >60 mL/min (ref >60)
Glucose, Bld: 87 mg/dL (ref 70–99)
Potassium: 4.2 mmol/L (ref 3.5–5.1)
Sodium: 138 mmol/L (ref 135–145)

## 2022-06-06 LAB — CULTURE, BLOOD (ROUTINE X 2): Special Requests: ADEQUATE

## 2022-06-06 LAB — HIV ANTIBODY (ROUTINE TESTING W REFLEX): HIV Screen 4th Generation wRfx: NONREACTIVE

## 2022-06-06 LAB — MRSA NEXT GEN BY PCR, NASAL: MRSA by PCR Next Gen: NOT DETECTED

## 2022-06-06 MED ORDER — HYDROCORTISONE 0.5 % EX CREA
TOPICAL_CREAM | Freq: Two times a day (BID) | CUTANEOUS | Status: DC
  Administered 2022-06-06 (×2): 1 via TOPICAL
  Filled 2022-06-06 (×3): qty 28.35

## 2022-06-06 MED ORDER — LACTATED RINGERS IV BOLUS
1000.0000 mL | Freq: Once | INTRAVENOUS | Status: AC
  Administered 2022-06-06: 1000 mL via INTRAVENOUS

## 2022-06-06 MED ORDER — ACETAMINOPHEN 325 MG PO TABS
650.0000 mg | ORAL_TABLET | ORAL | Status: DC | PRN
  Administered 2022-06-06 – 2022-06-08 (×3): 650 mg via ORAL
  Filled 2022-06-06 (×3): qty 2

## 2022-06-06 MED ORDER — TRIAMCINOLONE ACETONIDE 0.1 % EX CREA
TOPICAL_CREAM | Freq: Three times a day (TID) | CUTANEOUS | Status: DC
  Administered 2022-06-11 – 2022-06-16 (×6): 1 via TOPICAL
  Filled 2022-06-06 (×14): qty 15

## 2022-06-06 MED ORDER — SILVER SULFADIAZINE 1 % EX CREA
TOPICAL_CREAM | Freq: Two times a day (BID) | CUTANEOUS | Status: DC
  Administered 2022-06-06: 1 via TOPICAL
  Filled 2022-06-06 (×2): qty 85

## 2022-06-06 MED ORDER — SODIUM CHLORIDE 0.9% FLUSH
3.0000 mL | Freq: Two times a day (BID) | INTRAVENOUS | Status: DC
  Administered 2022-06-06 – 2022-06-17 (×22): 3 mL via INTRAVENOUS

## 2022-06-06 MED ORDER — ACETAMINOPHEN 325 MG PO TABS
650.0000 mg | ORAL_TABLET | Freq: Once | ORAL | Status: AC
  Administered 2022-06-06: 650 mg via ORAL
  Filled 2022-06-06: qty 2

## 2022-06-06 MED ORDER — TRIAMCINOLONE ACETONIDE 0.1 % EX CREA
TOPICAL_CREAM | Freq: Two times a day (BID) | CUTANEOUS | Status: DC
  Administered 2022-06-06 (×2): 1 via TOPICAL
  Filled 2022-06-06: qty 15

## 2022-06-06 MED ORDER — CEFAZOLIN SODIUM-DEXTROSE 2-4 GM/100ML-% IV SOLN
2.0000 g | Freq: Three times a day (TID) | INTRAVENOUS | Status: DC
  Filled 2022-06-06: qty 100

## 2022-06-06 MED ORDER — ACETAMINOPHEN 650 MG RE SUPP: 650.0000 mg | Freq: Four times a day (QID) | RECTAL | Status: DC | PRN

## 2022-06-06 MED ORDER — CEPHALEXIN 500 MG PO CAPS
500.0000 mg | ORAL_CAPSULE | Freq: Two times a day (BID) | ORAL | Status: DC
  Administered 2022-06-06 – 2022-06-07 (×2): 500 mg via ORAL
  Filled 2022-06-06 (×2): qty 1

## 2022-06-06 NOTE — ED Notes (Signed)
Pt given breakfast tray

## 2022-06-06 NOTE — Assessment & Plan Note (Signed)
-   given appearance of skin, high suspicion for contamination in blood cultures - repeat blood cultures now - see fever workup

## 2022-06-06 NOTE — Hospital Course (Signed)
Mr. Sigers is a 42 yo male with PMH eczema who presented with chills, cough, body aches, and leg swelling.  He felt that his eczema has been worsening and was having some flaking of his skin which typically happens. He was seen recently in the ER on 05/29/2022 for right ear pain and was noted to have ongoing skin changes at that time.  He was discharged with a prescription of Kenalog and ear exam was benign and reassuring.  Due to some reported hearing loss at the time, he was recommended to follow-up with ENT. On workup, he was felt to have a possible underlying component of cellulitis associated with his eczema.  He was started on antibiotics and blood cultures were also obtained. Due to the diffuse distribution, initially request for transfer to a tertiary center was pursued.  Upon further observation and monitoring after antibiotics, he appeared to be clinically improved and he was admitted to the hospital for further treatment and monitoring.

## 2022-06-06 NOTE — Progress Notes (Signed)
PHARMACY - PHYSICIAN COMMUNICATION CRITICAL VALUE ALERT - BLOOD CULTURE IDENTIFICATION (BCID)  Billy Ward is an 42 y.o. male who presented to [facility] on 06/05/2022 with a chief complaint of cellulitis  Assessment:  1/4 Bcx: staph aureus   Name of physician (or Provider) Contacted: [place]  Current antibiotics: ceftriaxone 2 g IV  q 24h   Changes to prescribed antibiotics recommended:  Cefazolin 2g IV q8h   No results found for this or any previous visit.  Calton Dach, PharmD Clinical Pharmacist 06/06/2022 2:47 PM

## 2022-06-06 NOTE — ED Notes (Signed)
ED TO INPATIENT HANDOFF REPORT  ED Nurse Name and Phone #: [phone]  S Name/Age/Gender Billy Ward 42 y.o. male Room/Bed: 011C/011C  Code Status   Code Status: Full Code  Home/SNF/Other Home Patient oriented to: self, person, place, time Is this baseline? Yes   Triage Complete: Triage complete  Chief Complaint Fever [R50.9]  Triage Note Pt arrives via POV. Pt has been staying at the [facility]. PT reports chills, cough, body aches, and leg swelling for the past two days. Pt states his eczema has worsened as well. Redness, swelling and warmth noted to bilateral legs.    Allergies No Known Allergies  Level of Care/Admitting Diagnosis ED Disposition     ED Disposition  Admit   Condition  --   Comment  Hospital Area: [facility] [[id number]]  Level of Care: Med-Surg [16]  May place patient in observation at [facility] or [facility] if equivalent level of care is available:: No  Covid Evaluation: Asymptomatic - no recent exposure (last 10 days) testing not required  Diagnosis: Fever [595638]  Admitting Physician: Lewie Chamber [id number]  Attending Physician: Lewie Chamber Mellisa.Dayhoff          B Medical/Surgery History No past medical history on file. No past surgical history on file.   A IV Location/Drains/Wounds Patient Lines/Drains/Airways Status     Active Line/Drains/Airways     None            Intake/Output Last 24 hours No intake or output data in the 24 hours ending 06/06/22 1039  Labs/Imaging Results for orders placed or performed during the hospital encounter of 06/05/22 (from the past 48 hour(s))  CBC     Status: Abnormal   Collection Time: 06/05/22  6:53 PM  Result Value Ref Range   WBC 9.7 4.0 - 10.5 K/uL   RBC 4.60 4.22 - 5.81 MIL/uL   Hemoglobin 12.5 (L) 13.0 - 17.0 g/dL   HCT 33.2 95.1 - 88.4 %   MCV 90.2 80.0 - 100.0 fL   MCH 27.2 26.0 - 34.0 pg   MCHC 30.1 30.0 - 36.0 g/dL   RDW 16.6 06.3 - 01.6 %   Platelets 367 150  - 400 K/uL   nRBC 0.0 0.0 - 0.2 %    Comment: Performed at [facility], [address], [place], [place] [address]  Comprehensive metabolic panel     Status: Abnormal   Collection Time: 06/05/22  6:53 PM  Result Value Ref Range   Sodium 136 135 - 145 mmol/L   Potassium 3.9 3.5 - 5.1 mmol/L   Chloride 103 98 - 111 mmol/L   CO2 24 22 - 32 mmol/L   Glucose, Bld 107 (H) 70 - 99 mg/dL    Comment: Glucose reference range applies only to samples taken after fasting for at least 8 hours.   BUN 10 6 - 20 mg/dL   Creatinine, Ser 2.35 (H) 0.61 - 1.24 mg/dL   Calcium 7.9 (L) 8.9 - 10.3 mg/dL   Total Protein 5.5 (L) 6.5 - 8.1 g/dL   Albumin 2.1 (L) 3.5 - 5.0 g/dL   AST 26 15 - 41 U/L   ALT 15 0 - 44 U/L   Alkaline Phosphatase 54 38 - 126 U/L   Total Bilirubin 0.5 0.3 - 1.2 mg/dL   GFR, Estimated >57 >32 mL/min    Comment: (NOTE) Calculated using the CKD-EPI Creatinine Equation (2021)    Anion gap 9 5 - 15    Comment:  Performed at [facility], [address] [place], [place] [address]  SARS Coronavirus 2 by RT PCR (hospital order, performed in [facility] hospital lab) *cepheid single result test* Anterior Nasal Swab     Status: None   Collection Time: 06/05/22  6:57 PM   Specimen: Anterior Nasal Swab  Result Value Ref Range   SARS Coronavirus 2 by RT PCR NEGATIVE NEGATIVE    Comment: Performed at [facility], [address], [place], [place] [address]  Lactic acid, plasma     Status: None   Collection Time: 06/05/22 10:00 PM  Result Value Ref Range   Lactic Acid, Venous 1.3 0.5 - 1.9 mmol/L    Comment: Performed at [facility], [address], [place], [place] [address]  Brain natriuretic peptide     Status: None   Collection Time: 06/05/22 10:00 PM  Result Value Ref Range   B Natriuretic Peptide 57.0 0.0 - 100.0 pg/mL    Comment: Performed at [facility], [address], [place], [place] [address]  Ethanol     Status: None   Collection Time:  06/05/22 10:27 PM  Result Value Ref Range   Alcohol, Ethyl (B) <10 <10 mg/dL    Comment: (NOTE) Lowest detectable limit for serum alcohol is 10 mg/dL.  For medical purposes only. Performed at [facility], [address] [place], [place] [address]   Resp panel by RT-PCR (RSV, Flu A&B, Covid) Anterior Nasal Swab     Status: None   Collection Time: 06/05/22 10:28 PM   Specimen: Anterior Nasal Swab  Result Value Ref Range   SARS Coronavirus 2 by RT PCR NEGATIVE NEGATIVE   Influenza A by PCR NEGATIVE NEGATIVE   Influenza B by PCR NEGATIVE NEGATIVE    Comment: (NOTE) The Xpert Xpress SARS-CoV-2/FLU/RSV plus assay is intended as an aid in the diagnosis of influenza from Nasopharyngeal swab specimens and should not be used as a sole basis for treatment. Nasal washings and aspirates are unacceptable for Xpert Xpress SARS-CoV-2/FLU/RSV testing.  Fact Sheet for Patients: BloggerCourse.com  Fact Sheet for Healthcare Providers: SeriousBroker.it  This test is not yet approved or cleared by the [place] FDA and has been authorized for detection and/or diagnosis of SARS-CoV-2 by FDA under an Emergency Use Authorization (EUA). This EUA will remain in effect (meaning this test can be used) for the duration of the COVID-19 declaration under Section 564(b)(1) of the Act, 21 U.S.C. section 360bbb-3(b)(1), unless the authorization is terminated or revoked.     Resp Syncytial Virus by PCR NEGATIVE NEGATIVE    Comment: (NOTE) Fact Sheet for Patients: BloggerCourse.com  Fact Sheet for Healthcare Providers: SeriousBroker.it  This test is not yet approved or cleared by the [place] FDA and has been authorized for detection and/or diagnosis of SARS-CoV-2 by FDA under an Emergency Use Authorization (EUA). This EUA will remain in effect (meaning this test can be used) for the  duration of the COVID-19 declaration under Section 564(b)(1) of the Act, 21 U.S.C. section 360bbb-3(b)(1), unless the authorization is terminated or revoked.  Performed at [facility], [address] [place], [place] [address]   HIV Antibody (routine testing w rflx)     Status: None   Collection Time: 06/06/22 12:13 AM  Result Value Ref Range   HIV Screen 4th Generation wRfx Non Reactive Non Reactive    Comment: Performed at [facility], [address] [place],  [place] [address]  Lactic acid, plasma     Status: None   Collection Time: 06/06/22 12:27 AM  Result Value Ref Range   Lactic Acid, Venous 1.3 0.5 - 1.9 mmol/L    Comment: Performed at [facility], [address], [place], [place] [address]  MRSA Next Gen by PCR, Nasal     Status: None   Collection Time: 06/06/22  6:21 AM   Specimen: Nasal Mucosa; Nasal Swab  Result Value Ref Range   MRSA by PCR Next Gen NOT DETECTED NOT DETECTED    Comment: (NOTE) The GeneXpert MRSA Assay (FDA approved for NASAL specimens only), is one component of a comprehensive MRSA colonization surveillance program. It is not intended to diagnose MRSA infection nor to guide or monitor treatment for MRSA infections. Test performance is not FDA approved in patients less than 44 years old. Performed at [facility], [address], [place], [place] [address]    DG Chest 2 View  Result Date: 06/05/2022 CLINICAL DATA:  cough, fever EXAM: CHEST - 2 VIEW COMPARISON:  None Available. FINDINGS: The heart and mediastinal contours are within normal limits. Vague airspace opacity along the mid right lower lung zone may be related to a nipple shadow. No pulmonary edema. No pleural effusion. No pneumothorax. No acute osseous abnormality. IMPRESSION: Vague airspace opacity along the mid right lower lung zone may be related to a nipple shadow. Recommend repeat frontal view with nipple markers. Electronically Signed   By: Tish Frederickson  M.D.   On: 06/05/2022 19:57    Pending Labs Unresulted Labs (From admission, onward)     Start     Ordered   06/05/22 2200  Rapid urine drug screen (hospital performed)  ONCE - STAT,   STAT        06/05/22 2200   06/05/22 2158  Blood Culture (routine x 2)  (Septic presentation on arrival (screening labs, nursing and treatment orders for obvious sepsis))  BLOOD CULTURE X 2,   STAT      06/05/22 2158   06/05/22 2158  Urinalysis, Routine w reflex microscopic -Urine, Clean Catch  (Septic presentation on arrival (screening labs, nursing and treatment orders for obvious sepsis))  ONCE - URGENT,   URGENT       Question:  Specimen Source  Answer:  Urine, Clean Catch   06/05/22 2158            Vitals/Pain Today's Vitals   06/06/22 0631 06/06/22 0800 06/06/22 1000 06/06/22 1002  BP: (!) 111/58 101/78    Pulse: 97 95 100 (!) 101  Resp: 16     Temp: 98 F (36.7 C)   99 F (37.2 C)  TempSrc:    Oral  SpO2: 100% 100% 100% 100%  Weight:      Height:      PainSc:        Isolation Precautions No active isolations  Medications Medications  cefTRIAXone (ROCEPHIN) 2 g in sodium chloride 0.9 % 100 mL IVPB (0 g Intravenous Stopped 06/05/22 2317)  hydrocortisone cream 0.5 % (1 Application Topical Given 06/06/22 0955)  silver sulfADIAZINE (SILVADENE) 1 % cream (1 Application Topical Given 06/06/22 0517)  acetaminophen (TYLENOL) tablet 650 mg (has no administration in time range)  triamcinolone cream (KENALOG) 0.1 % cream (has no administration in time range)  lactated ringers bolus 1,000 mL (0 mLs Intravenous Stopped 06/06/22 0015)  acetaminophen (TYLENOL) tablet 1,000 mg (1,000 mg Oral Given 06/05/22 2231)  lactated ringers bolus  1,000 mL (0 mLs Intravenous Stopped 06/06/22 0135)    Mobility walks      R Recommendations: See Admitting Provider Note  Report given to:   Additional Notes:

## 2022-06-06 NOTE — Assessment & Plan Note (Addendum)
-   Possible component of cellulitis although no specific localization on his body.  He has untreated diffuse eczema and likely poor skin hygiene given social situation (lives in a shelter) - Tmax so far 100.4; no leukocytosis; normal lactic; non-toxic appearing  - started on Rocephin on admission - unclear etiology; would favor de-escalate to keflex and finish short empiric course (changed to Ancef on 5/12 per ID until able to be evaluated) -Remains afebrile with no leukocytosis.  Procalcitonin also negative initially

## 2022-06-06 NOTE — Assessment & Plan Note (Signed)
-   Fairly wide distribution but suspect patient has difficulty maintaining adequate hygiene living and shelter - Patient may shower as needed - Continue triamcinolone and hydrocortisone

## 2022-06-06 NOTE — ED Provider Notes (Addendum)
Patient per overnight ED staff has no accepting doctor.  We have called every hospital in the state that has dermatology and they will not accept the patient in transfer.  I have reengaged with [facility] and they are willing to put him on their wait list but they expect that it might be several days until they would be willing to accept him in transfer.  At this time the hospitalist team is rounding on the patient in the ED.  There is no imminent plan for outside hospital admission.  He will continue to be managed by the hospitalist in the ED.  Overall hospitalist came down to evaluate the patient and they feel comfortable with admission at this time.  This chart was dictated using voice recognition software.  Despite best efforts to proofread,  errors can occur which can change the documentation meaning.    Virgina Norfolk, DO 06/06/22 1610    Virgina Norfolk, DO 06/06/22 1013

## 2022-06-06 NOTE — Assessment & Plan Note (Signed)
-   baseline creatinine ~ u/k (presumed normal) - patient presents with increase in creat >0.3 mg/dL above baseline, creat increase >1.5x baseline presumed to have occurred within past 7 days PTA - suspect some insensible losses and overall dehydration - s/p LR bolus in ER -Renal function normalized with fluids

## 2022-06-06 NOTE — H&P (Signed)
History and Physical    Billy Ward  ZHY:[id number]  DOB: Feb 23, 1980  DOA: 06/05/2022  PCP: Pcp, No Patient coming from: Home  Chief Complaint: fever, aches  HPI:  Billy Ward is a 42 yo male with PMH eczema who presented with chills, cough, body aches, and leg swelling.  He felt that his eczema has been worsening and was having some flaking of his skin which typically happens. He was seen recently in the ER on 05/29/2022 for right ear pain and was noted to have ongoing skin changes at that time.  He was discharged with a prescription of Kenalog and ear exam was benign and reassuring.  Due to some reported hearing loss at the time, he was recommended to follow-up with ENT. On workup, he was felt to have a possible underlying component of cellulitis associated with his eczema.  He was started on antibiotics and blood cultures were also obtained. Due to the diffuse distribution, initially request for transfer to a tertiary [facility] was pursued.  Upon further observation and monitoring after antibiotics, he appeared to be clinically improved and he was admitted to the hospital for further treatment and monitoring.  I have personally briefly reviewed patient's old medical records in [facility] ([place] and discussed patient with the ER provider when appropriate/indicated.  Assessment and Plan: * Fever - Possible component of cellulitis although no specific localization on his body.  He has untreated diffuse eczema and likely poor skin hygiene given social situation (lives in a shelter) - Tmax so far 100.4; no leukocytosis; normal lactic; non-toxic appearing  - started on Rocephin on admission - unclear etiology; would favor de-escalate to keflex and finish short empiric course  - repeat CBC and a procal today  AKI (acute kidney injury) (HCC) - baseline creatinine ~ u/k (presumed normal) - patient presents with increase in creat >0.3 mg/dL above baseline, creat increase >1.5x baseline presumed  to have occurred within past 7 days PTA - suspect some insensible losses and overall dehydration - s/p LR bolus in ER - repeat BMP today   Eczema - Fairly wide distribution but suspect patient has difficulty maintaining adequate hygiene living and shelter - Patient may shower as needed - Continue triamcinolone and hydrocortisone  Contamination of blood culture - given appearance of skin, high suspicion for contamination in blood cultures - repeat blood cultures now - see fever workup   Code Status:     Code Status: Full Code  DVT Prophylaxis: PADUA<4     Anticipated disposition is to: Home  History: History reviewed. No pertinent past medical history.  History reviewed. No pertinent surgical history.   reports that he has been smoking cigarettes. He has been smoking an average of 1 pack per day. He does not have any smokeless tobacco history on file. He reports current alcohol use. He reports current drug use. Drugs: Marijuana and Cocaine.  No Known Allergies  History reviewed. No pertinent family history.  Home Medications: Prior to Admission medications   Not on File    Review of Systems:  Review of Systems  Constitutional:  Positive for chills and fever.  HENT: Negative.    Eyes: Negative.   Respiratory: Negative.    Cardiovascular: Negative.   Gastrointestinal: Negative.   Genitourinary: Negative.   Musculoskeletal: Negative.   Skin:  Positive for itching and rash.  Neurological: Negative.   Endo/Heme/Allergies: Negative.   Psychiatric/Behavioral: Negative.      Physical Exam:  Vitals:   06/06/22 1000 06/06/22 1002 06/06/22 1150  06/06/22 1200  BP:   (!) 140/126 126/80  Pulse: 100 (!) 101 (!) 106 98  Resp:   18   Temp:  99 F (37.2 C) 99.6 F (37.6 C)   TempSrc:  Oral Oral   SpO2: 100% 100% 98%   Weight:      Height:       Physical Exam Constitutional:      General: He is not in acute distress.    Appearance: Normal appearance.  HENT:      Head: Normocephalic and atraumatic.     Mouth/Throat:     Mouth: Mucous membranes are moist.  Eyes:     Extraocular Movements: Extraocular movements intact.  Cardiovascular:     Rate and Rhythm: Normal rate and regular rhythm.  Pulmonary:     Effort: Pulmonary effort is normal. No respiratory distress.     Breath sounds: Normal breath sounds. No wheezing.  Abdominal:     General: Bowel sounds are normal. There is no distension.     Palpations: Abdomen is soft.     Tenderness: There is no abdominal tenderness.  Musculoskeletal:        General: Normal range of motion.     Cervical back: Normal range of motion and neck supple.  Skin:    General: Skin is warm and dry.     Findings: Rash (Mildly pruritic, flaking noted, dry, mild scattered edema in extremities) present. No erythema.  Neurological:     General: No focal deficit present.     Mental Status: He is alert.  Psychiatric:        Mood and Affect: Mood normal.        Behavior: Behavior normal.      Labs on Admission:  I have personally reviewed following labs and imaging studies Results for orders placed or performed during the hospital encounter of 06/05/22 (from the past 24 hour(s))  CBC     Status: Abnormal   Collection Time: 06/05/22  6:53 PM  Result Value Ref Range   WBC 9.7 4.0 - 10.5 K/uL   RBC 4.60 4.22 - 5.81 MIL/uL   Hemoglobin 12.5 (L) 13.0 - 17.0 g/dL   HCT 16.1 09.6 - 04.5 %   MCV 90.2 80.0 - 100.0 fL   MCH 27.2 26.0 - 34.0 pg   MCHC 30.1 30.0 - 36.0 g/dL   RDW 40.9 81.1 - 91.4 %   Platelets 367 150 - 400 K/uL   nRBC 0.0 0.0 - 0.2 %  Comprehensive metabolic panel     Status: Abnormal   Collection Time: 06/05/22  6:53 PM  Result Value Ref Range   Sodium 136 135 - 145 mmol/L   Potassium 3.9 3.5 - 5.1 mmol/L   Chloride 103 98 - 111 mmol/L   CO2 24 22 - 32 mmol/L   Glucose, Bld 107 (H) 70 - 99 mg/dL   BUN 10 6 - 20 mg/dL   Creatinine, Ser 7.82 (H) 0.61 - 1.24 mg/dL   Calcium 7.9 (L) 8.9 - 10.3 mg/dL    Total Protein 5.5 (L) 6.5 - 8.1 g/dL   Albumin 2.1 (L) 3.5 - 5.0 g/dL   AST 26 15 - 41 U/L   ALT 15 0 - 44 U/L   Alkaline Phosphatase 54 38 - 126 U/L   Total Bilirubin 0.5 0.3 - 1.2 mg/dL   GFR, Estimated >95 >62 mL/min   Anion gap 9 5 - 15  SARS Coronavirus 2 by RT PCR (hospital order, performed in  [facility] hospital lab) *cepheid single result test* Anterior Nasal Swab     Status: None   Collection Time: 06/05/22  6:57 PM   Specimen: Anterior Nasal Swab  Result Value Ref Range   SARS Coronavirus 2 by RT PCR NEGATIVE NEGATIVE  Lactic acid, plasma     Status: None   Collection Time: 06/05/22 10:00 PM  Result Value Ref Range   Lactic Acid, Venous 1.3 0.5 - 1.9 mmol/L  Blood Culture (routine x 2)     Status: None (Preliminary result)   Collection Time: 06/05/22 10:00 PM   Specimen: BLOOD  Result Value Ref Range   Specimen Description BLOOD SITE NOT SPECIFIED    Special Requests      BOTTLES DRAWN AEROBIC AND ANAEROBIC Blood Culture adequate volume   Culture      NO GROWTH < 24 HOURS Performed at [facility], [address] [place], [place] [address]    Report Status PENDING   Brain natriuretic peptide     Status: None   Collection Time: 06/05/22 10:00 PM  Result Value Ref Range   B Natriuretic Peptide 57.0 0.0 - 100.0 pg/mL  Ethanol     Status: None   Collection Time: 06/05/22 10:27 PM  Result Value Ref Range   Alcohol, Ethyl (B) <10 <10 mg/dL  Resp panel by RT-PCR (RSV, Flu A&B, Covid) Anterior Nasal Swab     Status: None   Collection Time: 06/05/22 10:28 PM   Specimen: Anterior Nasal Swab  Result Value Ref Range   SARS Coronavirus 2 by RT PCR NEGATIVE NEGATIVE   Influenza A by PCR NEGATIVE NEGATIVE   Influenza B by PCR NEGATIVE NEGATIVE   Resp Syncytial Virus by PCR NEGATIVE NEGATIVE  Blood Culture (routine x 2)     Status: None (Preliminary result)   Collection Time: 06/05/22 10:29 PM   Specimen: BLOOD  Result Value Ref Range   Specimen Description BLOOD  SITE NOT SPECIFIED    Special Requests      BOTTLES DRAWN AEROBIC AND ANAEROBIC Blood Culture adequate volume   Culture  Setup Time      GRAM POSITIVE COCCI AEROBIC BOTTLE ONLY CRITICAL RESULT CALLED TO, READ BACK BY AND VERIFIED WITH: [facility] MADELINE MITCHELL [id number] AT 1446 BY EC Performed at [facility], [address] [place], [place] [address]    Culture GRAM POSITIVE COCCI    Report Status PENDING   Blood Culture ID Panel (Reflexed)     Status: Abnormal   Collection Time: 06/05/22 10:29 PM  Result Value Ref Range   Enterococcus faecalis NOT DETECTED NOT DETECTED   Enterococcus Faecium NOT DETECTED NOT DETECTED   Listeria monocytogenes NOT DETECTED NOT DETECTED   Staphylococcus species DETECTED (A) NOT DETECTED   Staphylococcus aureus (BCID) DETECTED (A) NOT DETECTED   Staphylococcus epidermidis NOT DETECTED NOT DETECTED   Staphylococcus lugdunensis NOT DETECTED NOT DETECTED   Streptococcus species NOT DETECTED NOT DETECTED   Streptococcus agalactiae NOT DETECTED NOT DETECTED   Streptococcus pneumoniae NOT DETECTED NOT DETECTED   Streptococcus pyogenes NOT DETECTED NOT DETECTED   A.calcoaceticus-baumannii NOT DETECTED NOT DETECTED   Bacteroides fragilis NOT DETECTED NOT DETECTED   Enterobacterales NOT DETECTED NOT DETECTED   Enterobacter cloacae complex NOT DETECTED NOT DETECTED   Escherichia coli NOT DETECTED NOT DETECTED   Klebsiella aerogenes NOT DETECTED NOT DETECTED   Klebsiella oxytoca NOT DETECTED NOT DETECTED   Klebsiella pneumoniae NOT DETECTED NOT DETECTED   Proteus species NOT  DETECTED NOT DETECTED   Salmonella species NOT DETECTED NOT DETECTED   Serratia marcescens NOT DETECTED NOT DETECTED   Haemophilus influenzae NOT DETECTED NOT DETECTED   Neisseria meningitidis NOT DETECTED NOT DETECTED   Pseudomonas aeruginosa NOT DETECTED NOT DETECTED   Stenotrophomonas maltophilia NOT DETECTED NOT DETECTED   Candida albicans NOT DETECTED NOT DETECTED   Candida  auris NOT DETECTED NOT DETECTED   Candida glabrata NOT DETECTED NOT DETECTED   Candida krusei NOT DETECTED NOT DETECTED   Candida parapsilosis NOT DETECTED NOT DETECTED   Candida tropicalis NOT DETECTED NOT DETECTED   Cryptococcus neoformans/gattii NOT DETECTED NOT DETECTED   Meth resistant mecA/C and MREJ NOT DETECTED NOT DETECTED  HIV Antibody (routine testing w rflx)     Status: None   Collection Time: 06/06/22 12:13 AM  Result Value Ref Range   HIV Screen 4th Generation wRfx Non Reactive Non Reactive  Lactic acid, plasma     Status: None   Collection Time: 06/06/22 12:27 AM  Result Value Ref Range   Lactic Acid, Venous 1.3 0.5 - 1.9 mmol/L  MRSA Next Gen by PCR, Nasal     Status: None   Collection Time: 06/06/22  6:21 AM   Specimen: Nasal Mucosa; Nasal Swab  Result Value Ref Range   MRSA by PCR Next Gen NOT DETECTED NOT DETECTED     Radiological Exams on Admission: DG Chest 2 View  Result Date: 06/05/2022 CLINICAL DATA:  cough, fever EXAM: CHEST - 2 VIEW COMPARISON:  None Available. FINDINGS: The heart and mediastinal contours are within normal limits. Vague airspace opacity along the mid right lower lung zone may be related to a nipple shadow. No pulmonary edema. No pleural effusion. No pneumothorax. No acute osseous abnormality. IMPRESSION: Vague airspace opacity along the mid right lower lung zone may be related to a nipple shadow. Recommend repeat frontal view with nipple markers. Electronically Signed   By: Tish Frederickson M.D.   On: 06/05/2022 19:57   DG Chest 2 View  Final Result       Lewie Chamber, MD [facility] 06/06/2022, 3:17 PM

## 2022-06-06 NOTE — Consult Note (Signed)
Initial Consultation Note   Patient: Billy Ward ZOX:[id number] DOB: 11-11-1980 PCP: Pcp, No DOA: 06/05/2022 DOS: the patient was seen and examined on 06/06/2022 Primary service: Default, Provider, MD  Referring physician: Dr. Eloise Harman, EDP Reason for consult: Diffuse rash with concern for cellulitis  Assessment/Plan: Diffuse unspecified rash, possibly severe eczema with cellulitis, POA History of eczema HIV screening test ordered due to suspected new onset severe eczema, it was nonreactive on 06/06/2022 Will benefit from dermatology evaluation, EDP working on transfer to tertiary center with dermatology service. Continue IV antibiotics, currently on Rocephin 2 g x 7 days Follow MRSA screening test, cover for MRSA if positive Follow peripheral blood cultures  Bilateral lower extremity edema, suspect from hypoalbuminemia Albumin 2.1, total protein 5.5 Elevate lower extremities  Polysubstance abuse including cocaine and alcohol No signs of withdrawal at the time of this visit.   Thank you for involving [place] in the care of this patient.  Will continue to follow the patient along with you.  HPI: Billy Ward is a 42 y.o. male with past medical history of eczema, cocaine abuse, who presented to [facility] ED with complaints of a diffuse rash, pruritic, and draining serous fluid.  Symptoms started last week and are worsening.  Associated with recurrent chills and subjective fevers.  Also endorses edema in his lower extremities bilaterally.  Upon evaluation, the patient's sheet and bed covers are soaked with draining fluid from the rash.  The rash is diffused with majority of body involvement.  EDP has discussed the case with [facility] dermatology and their recommendations have been implemented by EDP.  Currently on Rocephin x 7 for cellulitis.  MRSA screening test has been ordered and is pending.  Peripheral blood cultures have also been ordered and are in process.  Due to dermatology service  currently not being available at [facility], EDP initiated transfer request to [facility], [facility], [facility], and [facility].  Awaiting bed availability.  Review of Systems: As mentioned in the history of present illness. All other systems reviewed and are negative. No past medical history on file. No past surgical history on file. Social History:  reports that he has been smoking cigarettes. He has been smoking an average of 1 pack per day. He does not have any smokeless tobacco history on file. He reports current alcohol use. He reports current drug use. Drugs: Marijuana and Cocaine.  No Known Allergies  Family history: None reported    Physical Exam: Vitals:   06/06/22 0215 06/06/22 0241 06/06/22 0330 06/06/22 0405  BP:  (!) 92/54 (!) 95/54 100/63  Pulse: 92 95 93 92  Resp:  16 16 16   Temp:      TempSrc:      SpO2: 100% 100% 100% 100%  Weight:      Height:      Well-developed well-nourished in no acute distress.  Alert and oriented x 3. Diffuse rash affecting majority of the body Bilateral lower extremity edema  Data Reviewed:  Reviewed vital signs, labs, and imaging.  Family Communication: None at bedside. Primary team communication: Discussed with Dr. Eloise Harman, EDP. Thank you very much for involving [place] in the care of your patient.  Author: Darlin Drop, DO 06/06/2022 5:13 AM  For on call review [email or web address].

## 2022-06-07 DIAGNOSIS — R509 Fever, unspecified: Secondary | ICD-10-CM | POA: Diagnosis present

## 2022-06-07 DIAGNOSIS — N179 Acute kidney failure, unspecified: Secondary | ICD-10-CM | POA: Diagnosis present

## 2022-06-07 DIAGNOSIS — R5381 Other malaise: Secondary | ICD-10-CM | POA: Diagnosis present

## 2022-06-07 DIAGNOSIS — L03116 Cellulitis of left lower limb: Secondary | ICD-10-CM | POA: Diagnosis present

## 2022-06-07 DIAGNOSIS — L03818 Cellulitis of other sites: Secondary | ICD-10-CM

## 2022-06-07 DIAGNOSIS — L03115 Cellulitis of right lower limb: Secondary | ICD-10-CM | POA: Diagnosis present

## 2022-06-07 DIAGNOSIS — L309 Dermatitis, unspecified: Secondary | ICD-10-CM | POA: Diagnosis present

## 2022-06-07 DIAGNOSIS — R7881 Bacteremia: Secondary | ICD-10-CM | POA: Diagnosis present

## 2022-06-07 DIAGNOSIS — H9201 Otalgia, right ear: Secondary | ICD-10-CM | POA: Diagnosis present

## 2022-06-07 DIAGNOSIS — M7989 Other specified soft tissue disorders: Secondary | ICD-10-CM | POA: Diagnosis present

## 2022-06-07 DIAGNOSIS — F17210 Nicotine dependence, cigarettes, uncomplicated: Secondary | ICD-10-CM | POA: Diagnosis present

## 2022-06-07 DIAGNOSIS — Z5901 Sheltered homelessness: Secondary | ICD-10-CM | POA: Diagnosis not present

## 2022-06-07 DIAGNOSIS — F1410 Cocaine abuse, uncomplicated: Secondary | ICD-10-CM | POA: Diagnosis present

## 2022-06-07 DIAGNOSIS — F1010 Alcohol abuse, uncomplicated: Secondary | ICD-10-CM | POA: Diagnosis present

## 2022-06-07 DIAGNOSIS — Z1152 Encounter for screening for COVID-19: Secondary | ICD-10-CM | POA: Diagnosis not present

## 2022-06-07 DIAGNOSIS — B9561 Methicillin susceptible Staphylococcus aureus infection as the cause of diseases classified elsewhere: Secondary | ICD-10-CM | POA: Diagnosis present

## 2022-06-07 LAB — CBC WITH DIFFERENTIAL/PLATELET
Abs Immature Granulocytes: 0.04 10*3/uL (ref 0.00–0.07)
Basophils Absolute: 0 10*3/uL (ref 0.0–0.1)
Basophils Relative: 0 %
Eosinophils Absolute: 1.6 10*3/uL — ABNORMAL HIGH (ref 0.0–0.5)
Eosinophils Relative: 21 %
HCT: 35.2 % — ABNORMAL LOW (ref 39.0–52.0)
Hemoglobin: 10.6 g/dL — ABNORMAL LOW (ref 13.0–17.0)
Immature Granulocytes: 1 %
Lymphocytes Relative: 17 %
Lymphs Abs: 1.4 10*3/uL (ref 0.7–4.0)
MCH: 27 pg (ref 26.0–34.0)
MCHC: 30.1 g/dL (ref 30.0–36.0)
MCV: 89.8 fL (ref 80.0–100.0)
Monocytes Absolute: 1.8 10*3/uL — ABNORMAL HIGH (ref 0.1–1.0)
Monocytes Relative: 23 %
Neutro Abs: 3.1 10*3/uL (ref 1.7–7.7)
Neutrophils Relative %: 38 %
Platelets: 331 10*3/uL (ref 150–400)
RBC: 3.92 MIL/uL — ABNORMAL LOW (ref 4.22–5.81)
RDW: 14.5 % (ref 11.5–15.5)
WBC: 8 10*3/uL (ref 4.0–10.5)
nRBC: 0 % (ref 0.0–0.2)

## 2022-06-07 LAB — BASIC METABOLIC PANEL
Anion gap: 7 (ref 5–15)
BUN: 12 mg/dL (ref 6–20)
CO2: 26 mmol/L (ref 22–32)
Calcium: 7.9 mg/dL — ABNORMAL LOW (ref 8.9–10.3)
Chloride: 104 mmol/L (ref 98–111)
Creatinine, Ser: 1.13 mg/dL (ref 0.61–1.24)
GFR, Estimated: >60 mL/min (ref >60)
Glucose, Bld: 112 mg/dL — ABNORMAL HIGH (ref 70–99)
Potassium: 3.9 mmol/L (ref 3.5–5.1)
Sodium: 137 mmol/L (ref 135–145)

## 2022-06-07 LAB — CULTURE, BLOOD (ROUTINE X 2)

## 2022-06-07 LAB — MAGNESIUM: Magnesium: 1.9 mg/dL (ref 1.7–2.4)

## 2022-06-07 MED ORDER — LACTATED RINGERS IV BOLUS
1000.0000 mL | Freq: Once | INTRAVENOUS | Status: AC
  Administered 2022-06-07: 1000 mL via INTRAVENOUS

## 2022-06-07 MED ORDER — CEFAZOLIN SODIUM-DEXTROSE 2-4 GM/100ML-% IV SOLN
2.0000 g | Freq: Three times a day (TID) | INTRAVENOUS | Status: DC
  Administered 2022-06-07 – 2022-06-17 (×29): 2 g via INTRAVENOUS
  Filled 2022-06-07 (×30): qty 100

## 2022-06-07 NOTE — Plan of Care (Signed)
A/ox4 and on room air. No complaints of pain this shift. Patient self applies skin creams as scheduled. Self care. No overnight issues.    Problem: Education: Goal: Knowledge of General Education information will improve Description: Including pain rating scale, medication(s)/side effects and non-pharmacologic comfort measures Outcome: Progressing   Problem: Health Behavior/Discharge Planning: Goal: Ability to manage health-related needs will improve Outcome: Progressing   Problem: Clinical Measurements: Goal: Ability to maintain clinical measurements within normal limits will improve Outcome: Progressing Goal: Will remain free from infection Outcome: Progressing Goal: Diagnostic test results will improve Outcome: Progressing Goal: Respiratory complications will improve Outcome: Progressing Goal: Cardiovascular complication will be avoided Outcome: Progressing   Problem: Activity: Goal: Risk for activity intolerance will decrease Outcome: Progressing   Problem: Nutrition: Goal: Adequate nutrition will be maintained Outcome: Progressing   Problem: Coping: Goal: Level of anxiety will decrease Outcome: Progressing   Problem: Elimination: Goal: Will not experience complications related to bowel motility Outcome: Progressing Goal: Will not experience complications related to urinary retention Outcome: Progressing   Problem: Pain Managment: Goal: General experience of comfort will improve Outcome: Progressing   Problem: Safety: Goal: Ability to remain free from injury will improve Outcome: Progressing   Problem: Skin Integrity: Goal: Risk for impaired skin integrity will decrease Outcome: Progressing

## 2022-06-07 NOTE — Consult Note (Signed)
[facility]  Total days of antibiotics 2 Reason for Consult:MSSA bacteremia    Referring Physician: girguis/autoconsult  Principal Problem:   Fever Active Problems:   Contamination of blood culture   Eczema   AKI (acute kidney injury) (HCC)    HPI: Casimier Dumitrescu is a 42 y.o. male with alcohol dependence, eczema, and AKI, admitted on 5/10 for hx of feeling poorly x 1 week, in addition to nightsweats and chills. He mentioned that he is having diffuse worsening rash. He reports that his rash is intermittent sometimes worsened by chemical/detergent. He does not use any emoliants routinely.  He states the last time he had cocaine was a few days ago but mostly is drinks alcohol day. No IVDU. He was started on ceftriaxone for cellulitis by the ED. On admit, his wbc was 9.7 with fever of 100.25F. he was changed to cephalexin yesterday with possibility that blood cx were contaminant given only 1 of 4 bottles and has eczema involvement to whole body.  History reviewed. No pertinent past medical history.  Allergies: No Known Allergies  MEDICATIONS:  hydrocortisone cream   Topical BID   sodium chloride flush  3 mL Intravenous Q12H   triamcinolone cream   Topical TID    Social History   Tobacco Use   Smoking status: Every Day    Packs/day: 1    Types: Cigarettes  Substance Use Topics   Alcohol use: Yes    Comment: occasional   Drug use: Yes    Types: Marijuana, Cocaine    Comment: marijuana former user    History reviewed. No pertinent family history.  Review of Systems  Constitutional: Negative for fever, chills, diaphoresis, activity change, appetite change, fatigue and unexpected weight change.  HENT: Negative for congestion, sore throat, rhinorrhea, sneezing, trouble swallowing and sinus pressure.  Eyes: Negative for photophobia and visual disturbance.  Respiratory: Negative for cough, chest tightness, shortness of breath, wheezing and stridor.   Cardiovascular: Negative for chest pain, palpitations and leg swelling.  Gastrointestinal: Negative for nausea, vomiting, abdominal pain, diarrhea, constipation, blood in stool, abdominal distention and anal bleeding.  Genitourinary: Negative for dysuria, hematuria, flank pain and difficulty urinating.  Musculoskeletal: Negative for myalgias, back pain, joint swelling, arthralgias and gait problem.  Skin:+pruritic rash. Negative for color change, pallor, rash and wound.  Neurological: Negative for dizziness, tremors, weakness and light-headedness.  Hematological: Negative for adenopathy. Does not bruise/bleed easily.  Psychiatric/Behavioral: Negative for behavioral problems, confusion, sleep disturbance, dysphoric mood, decreased concentration and agitation.    OBJECTIVE: Temp:  [98.3 F (36.8 C)-99.6 F (37.6 C)] 98.3 F (36.8 C) (05/12 0510) Pulse Rate:  [96-107] 96 (05/12 0510) Resp:  [16-18] 16 (05/12 0510) BP: (104-140)/(56-126) 114/56 (05/12 0510) SpO2:  [98 %-99 %] 99 % (05/12 0510) Physical Exam  Constitutional: He is oriented to person, place, and time. He appears well-developed and well-nourished. No distress.  HENT:  Mouth/Throat: Oropharynx is clear and moist. No oropharyngeal exudate.  Cardiovascular: Normal rate, regular rhythm and normal heart sounds. Exam reveals no gallop and no friction rub.  No murmur heard.  Pulmonary/Chest: Effort normal and breath sounds normal. No respiratory distress. He has no wheezes.  Abdominal: Soft. Bowel sounds are normal. He exhibits no distension. There is no tenderness.  Lymphadenopathy:  He has no cervical adenopathy.  Neurological: He is alert and oriented to person, place, and time.  Skin: Skin is warm and dry. Excoriation marks to back and legs and arms. No open  wounds. Dry, flaking throughout extremities and torso and face. Psychiatric: He has a normal mood and affect. His behavior is normal.   LABS: Results for orders placed  or performed during the hospital encounter of 06/05/22 (from the past 48 hour(s))  CBC     Status: Abnormal   Collection Time: 06/05/22  6:53 PM  Result Value Ref Range   WBC 9.7 4.0 - 10.5 K/uL   RBC 4.60 4.22 - 5.81 MIL/uL   Hemoglobin 12.5 (L) 13.0 - 17.0 g/dL   HCT 91.4 78.2 - 95.6 %   MCV 90.2 80.0 - 100.0 fL   MCH 27.2 26.0 - 34.0 pg   MCHC 30.1 30.0 - 36.0 g/dL   RDW 21.3 08.6 - 57.8 %   Platelets 367 150 - 400 K/uL   nRBC 0.0 0.0 - 0.2 %    Comment: Performed at [facility] Lab, [address] [place], [place] [address]  Comprehensive metabolic panel     Status: Abnormal   Collection Time: 06/05/22  6:53 PM  Result Value Ref Range   Sodium 136 135 - 145 mmol/L   Potassium 3.9 3.5 - 5.1 mmol/L   Chloride 103 98 - 111 mmol/L   CO2 24 22 - 32 mmol/L   Glucose, Bld 107 (H) 70 - 99 mg/dL    Comment: Glucose reference range applies only to samples taken after fasting for at least 8 hours.   BUN 10 6 - 20 mg/dL   Creatinine, Ser 9.52 (H) 0.61 - 1.24 mg/dL   Calcium 7.9 (L) 8.9 - 10.3 mg/dL   Total Protein 5.5 (L) 6.5 - 8.1 g/dL   Albumin 2.1 (L) 3.5 - 5.0 g/dL   AST 26 15 - 41 U/L   ALT 15 0 - 44 U/L   Alkaline Phosphatase 54 38 - 126 U/L   Total Bilirubin 0.5 0.3 - 1.2 mg/dL   GFR, Estimated >84 >13 mL/min    Comment: (NOTE) Calculated using the CKD-EPI Creatinine Equation (2021)    Anion gap 9 5 - 15    Comment: Performed at [facility], [address], [place], [place] [address]  SARS Coronavirus 2 by RT PCR (hospital order, performed in [facility] hospital lab) *cepheid single result test* Anterior Nasal Swab     Status: None   Collection Time: 06/05/22  6:57 PM   Specimen: Anterior Nasal Swab  Result Value Ref Range   SARS Coronavirus 2 by RT PCR NEGATIVE NEGATIVE    Comment: Performed at [facility] Lab, [address] [place], [place] [address]  Lactic acid, plasma     Status: None   Collection Time: 06/05/22 10:00 PM  Result Value Ref Range    Lactic Acid, Venous 1.3 0.5 - 1.9 mmol/L    Comment: Performed at [facility], [address] [place], [place] [address]  Blood Culture (routine x 2)     Status: None (Preliminary result)   Collection Time: 06/05/22 10:00 PM   Specimen: BLOOD  Result Value Ref Range   Specimen Description BLOOD SITE NOT SPECIFIED    Special Requests      BOTTLES DRAWN AEROBIC AND ANAEROBIC Blood Culture adequate volume   Culture      NO GROWTH < 24 HOURS Performed at [facility], [address] [place], [place] [address]    Report Status PENDING   Brain natriuretic peptide     Status: None   Collection Time: 06/05/22 10:00  PM  Result Value Ref Range   B Natriuretic Peptide 57.0 0.0 - 100.0 pg/mL    Comment: Performed at [facility] Lab, [address], [place], [place] [address]  Ethanol     Status: None   Collection Time: 06/05/22 10:27 PM  Result Value Ref Range   Alcohol, Ethyl (B) <10 <10 mg/dL    Comment: (NOTE) Lowest detectable limit for serum alcohol is 10 mg/dL.  For medical purposes only. Performed at [facility], [address] [place], [place] [address]   Resp panel by RT-PCR (RSV, Flu A&B, Covid) Anterior Nasal Swab     Status: None   Collection Time: 06/05/22 10:28 PM   Specimen: Anterior Nasal Swab  Result Value Ref Range   SARS Coronavirus 2 by RT PCR NEGATIVE NEGATIVE   Influenza A by PCR NEGATIVE NEGATIVE   Influenza B by PCR NEGATIVE NEGATIVE    Comment: (NOTE) The Xpert Xpress SARS-CoV-2/FLU/RSV plus assay is intended as an aid in the diagnosis of influenza from Nasopharyngeal swab specimens and should not be used as a sole basis for treatment. Nasal washings and aspirates are unacceptable for Xpert Xpress SARS-CoV-2/FLU/RSV testing.  Fact Sheet for Patients: BloggerCourse.com  Fact Sheet for Healthcare Providers: SeriousBroker.it  This test is not yet approved or cleared by the [place] FDA and has been authorized for detection and/or diagnosis of SARS-CoV-2 by FDA under an Emergency Use Authorization (EUA). This EUA will remain in effect (meaning this test can be used) for the duration of the COVID-19 declaration under Section 564(b)(1) of the Act, 21 U.S.C. section 360bbb-3(b)(1), unless the authorization is terminated or revoked.     Resp Syncytial Virus by PCR NEGATIVE NEGATIVE    Comment: (NOTE) Fact Sheet for Patients: BloggerCourse.com  Fact Sheet for Healthcare Providers: SeriousBroker.it  This test is not yet approved or cleared by the [place] FDA and has been authorized for detection and/or diagnosis of SARS-CoV-2 by FDA under an Emergency Use Authorization (EUA). This EUA will remain in effect (meaning this test can be used) for the duration of the COVID-19 declaration under Section 564(b)(1) of the Act, 21 U.S.C. section 360bbb-3(b)(1), unless the authorization is terminated or revoked.  Performed at [facility] Lab, [address] [place], [place] [address]   Blood Culture (routine x 2)     Status: Abnormal (Preliminary result)   Collection Time: 06/05/22 10:29 PM   Specimen: BLOOD  Result Value Ref Range   Specimen Description BLOOD SITE NOT SPECIFIED    Special Requests      BOTTLES DRAWN AEROBIC AND ANAEROBIC Blood Culture adequate volume   Culture  Setup Time      GRAM POSITIVE COCCI AEROBIC BOTTLE ONLY CRITICAL RESULT CALLED TO, READ BACK BY AND VERIFIED WITH: PHARMD MADELINE MITCHELL [id number] AT 1446 BY EC    Culture (A)     STAPHYLOCOCCUS AUREUS SUSCEPTIBILITIES TO FOLLOW Performed at [facility] Lab, [address], [place], [place] [address]    Report Status PENDING   Blood Culture ID Panel (Reflexed)     Status: Abnormal   Collection Time: 06/05/22 10:29 PM  Result Value Ref Range   Enterococcus faecalis NOT DETECTED NOT DETECTED   Enterococcus Faecium NOT  DETECTED NOT DETECTED   Listeria monocytogenes NOT DETECTED NOT DETECTED   Staphylococcus species DETECTED (A) NOT DETECTED    Comment: CRITICAL RESULT CALLED TO, READ BACK BY AND VERIFIED WITH: PHARMD MADELINE MITCHELL [id number] AT 1446 BY  EC    Staphylococcus aureus (BCID) DETECTED (A) NOT DETECTED    Comment: CRITICAL RESULT CALLED TO, READ BACK BY AND VERIFIED WITH: PHARMD MADELINE MITCHELL [id number] AT 1446 BY EC    Staphylococcus epidermidis NOT DETECTED NOT DETECTED   Staphylococcus lugdunensis NOT DETECTED NOT DETECTED   Streptococcus species NOT DETECTED NOT DETECTED   Streptococcus agalactiae NOT DETECTED NOT DETECTED   Streptococcus pneumoniae NOT DETECTED NOT DETECTED   Streptococcus pyogenes NOT DETECTED NOT DETECTED   A.calcoaceticus-baumannii NOT DETECTED NOT DETECTED   Bacteroides fragilis NOT DETECTED NOT DETECTED   Enterobacterales NOT DETECTED NOT DETECTED   Enterobacter cloacae complex NOT DETECTED NOT DETECTED   Escherichia coli NOT DETECTED NOT DETECTED   Klebsiella aerogenes NOT DETECTED NOT DETECTED   Klebsiella oxytoca NOT DETECTED NOT DETECTED   Klebsiella pneumoniae NOT DETECTED NOT DETECTED   Proteus species NOT DETECTED NOT DETECTED   Salmonella species NOT DETECTED NOT DETECTED   Serratia marcescens NOT DETECTED NOT DETECTED   Haemophilus influenzae NOT DETECTED NOT DETECTED   Neisseria meningitidis NOT DETECTED NOT DETECTED   Pseudomonas aeruginosa NOT DETECTED NOT DETECTED   Stenotrophomonas maltophilia NOT DETECTED NOT DETECTED   Candida albicans NOT DETECTED NOT DETECTED   Candida auris NOT DETECTED NOT DETECTED   Candida glabrata NOT DETECTED NOT DETECTED   Candida krusei NOT DETECTED NOT DETECTED   Candida parapsilosis NOT DETECTED NOT DETECTED   Candida tropicalis NOT DETECTED NOT DETECTED   Cryptococcus neoformans/gattii NOT DETECTED NOT DETECTED   Meth resistant mecA/C and MREJ NOT DETECTED NOT DETECTED    Comment: Performed at [facility] Lab, [address] [place], [place] [address]  HIV Antibody (routine testing w rflx)     Status: None   Collection Time: 06/06/22 12:13 AM  Result Value Ref Range   HIV Screen 4th Generation wRfx Non Reactive Non Reactive    Comment: Performed at [facility], [address] [place], [place] [address]  Lactic acid, plasma     Status: None   Collection Time: 06/06/22 12:27 AM  Result Value Ref Range   Lactic Acid, Venous 1.3 0.5 - 1.9 mmol/L    Comment: Performed at [facility], [address], [place], [place] [address]  MRSA Next Gen by PCR, Nasal     Status: None   Collection Time: 06/06/22  6:21 AM   Specimen: Nasal Mucosa; Nasal Swab  Result Value Ref Range   MRSA by PCR Next Gen NOT DETECTED NOT DETECTED    Comment: (NOTE) The GeneXpert MRSA Assay (FDA approved for NASAL specimens only), is one component of a comprehensive MRSA colonization surveillance program. It is not intended to diagnose MRSA infection nor to guide or monitor treatment for MRSA infections. Test performance is not FDA approved in patients less than 52 years old. Performed at [facility] Lab, [address], [place], [place] [address]   Basic metabolic panel     Status: Abnormal   Collection Time: 06/06/22  3:51 PM  Result Value Ref Range   Sodium 138 135 - 145 mmol/L   Potassium 4.2 3.5 - 5.1 mmol/L   Chloride 102 98 - 111 mmol/L   CO2 27 22 - 32 mmol/L   Glucose, Bld 87 70 - 99 mg/dL    Comment: Glucose reference range applies only to samples taken after fasting for at least 8 hours.   BUN 8 6 - 20 mg/dL   Creatinine, Ser 8.65 0.61 - 1.24  mg/dL   Calcium 7.9 (L) 8.9 - 10.3 mg/dL   GFR, Estimated >16 >10 mL/min    Comment: (NOTE) Calculated using the CKD-EPI Creatinine Equation (2021)    Anion gap 9 5 - 15    Comment: Performed at [facility] Lab, [address], [place], [place] [address]  CBC with Differential/Platelet     Status: Abnormal   Collection Time: 06/06/22   3:51 PM  Result Value Ref Range   WBC 8.2 4.0 - 10.5 K/uL   RBC 4.19 (L) 4.22 - 5.81 MIL/uL   Hemoglobin 11.5 (L) 13.0 - 17.0 g/dL   HCT 40.9 (L) 81.1 - 91.4 %   MCV 91.9 80.0 - 100.0 fL   MCH 27.4 26.0 - 34.0 pg   MCHC 29.9 (L) 30.0 - 36.0 g/dL   RDW 78.2 95.6 - 21.3 %   Platelets 327 150 - 400 K/uL   nRBC 0.0 0.0 - 0.2 %   Neutrophils Relative % 41 %   Neutro Abs 3.5 1.7 - 7.7 K/uL   Lymphocytes Relative 15 %   Lymphs Abs 1.2 0.7 - 4.0 K/uL   Monocytes Relative 22 %   Monocytes Absolute 1.8 (H) 0.1 - 1.0 K/uL   Eosinophils Relative 20 %   Eosinophils Absolute 1.6 (H) 0.0 - 0.5 K/uL   Basophils Relative 1 %   Basophils Absolute 0.1 0.0 - 0.1 K/uL   Immature Granulocytes 1 %   Abs Immature Granulocytes 0.04 0.00 - 0.07 K/uL    Comment: Performed at [facility], [address] [place], [place] [address]  Procalcitonin     Status: None   Collection Time: 06/06/22  3:51 PM  Result Value Ref Range   Procalcitonin <0.10 ng/mL    Comment:        Interpretation: PCT (Procalcitonin) <= 0.5 ng/mL: Systemic infection (sepsis) is not likely. Local bacterial infection is possible. (NOTE)       Sepsis PCT Algorithm           Lower Respiratory Tract                                      Infection PCT Algorithm    ----------------------------     ----------------------------         PCT < 0.25 ng/mL                PCT < 0.10 ng/mL          Strongly encourage             Strongly discourage   discontinuation of antibiotics    initiation of antibiotics    ----------------------------     -----------------------------       PCT 0.25 - 0.50 ng/mL            PCT 0.10 - 0.25 ng/mL               OR       >80% decrease in PCT            Discourage initiation of                                            antibiotics      Encourage discontinuation  of antibiotics    ----------------------------     -----------------------------         PCT >= 0.50 ng/mL              PCT 0.26  - 0.50 ng/mL               AND        <80% decrease in PCT             Encourage initiation of                                             antibiotics       Encourage continuation           of antibiotics    ----------------------------     -----------------------------        PCT >= 0.50 ng/mL                  PCT > 0.50 ng/mL               AND         increase in PCT                  Strongly encourage                                      initiation of antibiotics    Strongly encourage escalation           of antibiotics                                     -----------------------------                                           PCT <= 0.25 ng/mL                                                 OR                                        > 80% decrease in PCT                                      Discontinue / Do not initiate                                             antibiotics  Performed at [facility] Lab, [address], [place], [place] [address]   Culture, blood (Routine X 2) w Reflex to ID Panel     Status: None (Preliminary result)   Collection Time: 06/06/22  3:51 PM   Specimen:  BLOOD LEFT HAND  Result Value Ref Range   Specimen Description BLOOD LEFT HAND    Special Requests      BOTTLES DRAWN AEROBIC AND ANAEROBIC Blood Culture adequate volume   Culture  Setup Time      GRAM POSITIVE COCCI AEROBIC BOTTLE ONLY CRITICAL RESULT CALLED TO, READ BACK BY AND VERIFIED WITH: [facility] MADELINE MITCHELL [id number] AT 0829 BY EC Performed at [facility], [address], [place], [place] [address]    Culture GRAM POSITIVE COCCI    Report Status PENDING   Basic metabolic panel     Status: Abnormal   Collection Time: 06/07/22  3:37 AM  Result Value Ref Range   Sodium 137 135 - 145 mmol/L   Potassium 3.9 3.5 - 5.1 mmol/L   Chloride 104 98 - 111 mmol/L   CO2 26 22 - 32 mmol/L   Glucose, Bld 112 (H) 70 - 99 mg/dL    Comment: Glucose reference range applies only to samples  taken after fasting for at least 8 hours.   BUN 12 6 - 20 mg/dL   Creatinine, Ser 1.19 0.61 - 1.24 mg/dL   Calcium 7.9 (L) 8.9 - 10.3 mg/dL   GFR, Estimated >14 >78 mL/min    Comment: (NOTE) Calculated using the CKD-EPI Creatinine Equation (2021)    Anion gap 7 5 - 15    Comment: Performed at [facility], [address], [place], [place] [address]  CBC with Differential/Platelet     Status: Abnormal   Collection Time: 06/07/22  3:37 AM  Result Value Ref Range   WBC 8.0 4.0 - 10.5 K/uL   RBC 3.92 (L) 4.22 - 5.81 MIL/uL   Hemoglobin 10.6 (L) 13.0 - 17.0 g/dL   HCT 13.0 (L) 86.5 - 78.4 %   MCV 89.8 80.0 - 100.0 fL   MCH 27.0 26.0 - 34.0 pg   MCHC 30.1 30.0 - 36.0 g/dL   RDW 69.6 29.5 - 28.4 %   Platelets 331 150 - 400 K/uL   nRBC 0.0 0.0 - 0.2 %   Neutrophils Relative % 38 %   Neutro Abs 3.1 1.7 - 7.7 K/uL   Lymphocytes Relative 17 %   Lymphs Abs 1.4 0.7 - 4.0 K/uL   Monocytes Relative 23 %   Monocytes Absolute 1.8 (H) 0.1 - 1.0 K/uL   Eosinophils Relative 21 %   Eosinophils Absolute 1.6 (H) 0.0 - 0.5 K/uL   Basophils Relative 0 %   Basophils Absolute 0.0 0.0 - 0.1 K/uL   Immature Granulocytes 1 %   Abs Immature Granulocytes 0.04 0.00 - 0.07 K/uL    Comment: Performed at [facility], [address], [place], [place] [address]  Magnesium     Status: None   Collection Time: 06/07/22  3:37 AM  Result Value Ref Range   Magnesium 1.9 1.7 - 2.4 mg/dL    Comment: Performed at [facility] Lab, [address] [place], [place] [address]    MICRO:  IMAGING: DG Chest 2 View  Result Date: 06/05/2022 CLINICAL DATA:  cough, fever EXAM: CHEST - 2 VIEW COMPARISON:  None Available. FINDINGS: The heart and mediastinal contours are within normal limits. Vague airspace opacity along the mid right lower lung zone may be related to a nipple shadow. No pulmonary edema. No pleural effusion. No pneumothorax. No acute osseous abnormality. IMPRESSION: Vague airspace opacity along  the mid right lower lung zone may be related to a nipple  shadow. Recommend repeat frontal view with nipple markers. Electronically Signed   By: Tish Frederickson M.D.   On: 06/05/2022 19:57    HISTORICAL MICRO/IMAGING  Assessment/Plan:   42yo M with malaise and rash, worsening eczema found with low grade fever and mssa bacteremia  - would recommend to switch back to cefazolin to treat as simple bacteremia once excluding metastatic disease/endocarditis. Potentially this could be contaminant given the condition of his skin however, it would not explain his malaise/nightsweats/isolated fever on admit.  Cellulitis + possible bacteremia = will resume back to cefazolin  - will repeat blood cx tomorrow. - will hold acetominophen to see if still having fevers.  Etoh use = watch for withdrawal symptoms  Eczema = would give a large container of triamcinolone to apply diffusely to skin to see if improvement. Can instruct patient to apply daily.

## 2022-06-07 NOTE — Progress Notes (Signed)
PHARMACY - PHYSICIAN COMMUNICATION CRITICAL VALUE ALERT - BLOOD CULTURE IDENTIFICATION (BCID)  Billy Ward is an 42 y.o. male who presented to [facility] on [facility]/05/2022 with a chief complaint of cellulitis  Assessment:  1/4 Bcx: GPC (BCID already ran) Now 1/4 BCX growing staph aureus in 2 sets of blood cultures   Name of physician (or Provider) Contacted: Girguis  Current antibiotics: cephalexin 500 bid   Changes to prescribed antibiotics recommended:  Will d/w MD: Cefazolin 2g IV q8h   Results for orders placed or performed during the hospital encounter of 06/05/22  Blood Culture ID Panel (Reflexed) (Collected: 06/05/2022 10:29 PM)  Result Value Ref Range   Enterococcus faecalis NOT DETECTED NOT DETECTED   Enterococcus Faecium NOT DETECTED NOT DETECTED   Listeria monocytogenes NOT DETECTED NOT DETECTED   Staphylococcus species DETECTED (A) NOT DETECTED   Staphylococcus aureus (BCID) DETECTED (A) NOT DETECTED   Staphylococcus epidermidis NOT DETECTED NOT DETECTED   Staphylococcus lugdunensis NOT DETECTED NOT DETECTED   Streptococcus species NOT DETECTED NOT DETECTED   Streptococcus agalactiae NOT DETECTED NOT DETECTED   Streptococcus pneumoniae NOT DETECTED NOT DETECTED   Streptococcus pyogenes NOT DETECTED NOT DETECTED   A.calcoaceticus-baumannii NOT DETECTED NOT DETECTED   Bacteroides fragilis NOT DETECTED NOT DETECTED   Enterobacterales NOT DETECTED NOT DETECTED   Enterobacter cloacae complex NOT DETECTED NOT DETECTED   Escherichia coli NOT DETECTED NOT DETECTED   Klebsiella aerogenes NOT DETECTED NOT DETECTED   Klebsiella oxytoca NOT DETECTED NOT DETECTED   Klebsiella pneumoniae NOT DETECTED NOT DETECTED   Proteus species NOT DETECTED NOT DETECTED   Salmonella species NOT DETECTED NOT DETECTED   Serratia marcescens NOT DETECTED NOT DETECTED   Haemophilus influenzae NOT DETECTED NOT DETECTED   Neisseria meningitidis NOT DETECTED NOT DETECTED   Pseudomonas aeruginosa NOT  DETECTED NOT DETECTED   Stenotrophomonas maltophilia NOT DETECTED NOT DETECTED   Candida albicans NOT DETECTED NOT DETECTED   Candida auris NOT DETECTED NOT DETECTED   Candida glabrata NOT DETECTED NOT DETECTED   Candida krusei NOT DETECTED NOT DETECTED   Candida parapsilosis NOT DETECTED NOT DETECTED   Candida tropicalis NOT DETECTED NOT DETECTED   Cryptococcus neoformans/gattii NOT DETECTED NOT DETECTED   Meth resistant mecA/C and MREJ NOT DETECTED NOT DETECTED    Calton Dach, PharmD Clinical Pharmacist 06/07/2022 8:29 AM

## 2022-06-07 NOTE — Progress Notes (Signed)
Progress Note    Billy Ward   ZOX:[id number]  DOB: 21-Nov-1980  DOA: 06/05/2022     0 PCP: Pcp, No  Initial CC: fever  Hospital Course: Mr. Billy Ward is a 42 yo male with PMH eczema who presented with chills, cough, body aches, and leg swelling.  He felt that his eczema has been worsening and was having some flaking of his skin which typically happens. He was seen recently in the ER on 05/29/2022 for right ear pain and was noted to have ongoing skin changes at that time.  He was discharged with a prescription of Kenalog and ear exam was benign and reassuring.  Due to some reported hearing loss at the time, he was recommended to follow-up with ENT. On workup, he was felt to have a possible underlying component of cellulitis associated with his eczema.  He was started on antibiotics and blood cultures were also obtained. Due to the diffuse distribution, initially request for transfer to a tertiary center was pursued.  Upon further observation and monitoring after antibiotics, he appeared to be clinically improved and he was admitted to the hospital for further treatment and monitoring.  Interval History:  No events overnight.  Was feeling much better when seen this morning.  No further fevers, chills, sweats, aches.  Also has been able to shower and felt better after doing so.  Assessment and Plan: * Fever - Possible component of cellulitis although no specific localization on his body.  He has untreated diffuse eczema and likely poor skin hygiene given social situation (lives in a shelter) - Tmax so far 100.4; no leukocytosis; normal lactic; non-toxic appearing  - started on Rocephin on admission - unclear etiology; would favor de-escalate to keflex and finish short empiric course (changed to Ancef on 5/12 per ID until able to be evaluated) -Remains afebrile with no leukocytosis.  Procalcitonin also negative initially  AKI (acute kidney injury) (HCC)-resolved as of 06/07/2022 - baseline  creatinine ~ u/k (presumed normal) - patient presents with increase in creat >0.3 mg/dL above baseline, creat increase >1.5x baseline presumed to have occurred within past 7 days PTA - suspect some insensible losses and overall dehydration - s/p LR bolus in ER -Renal function normalized with fluids   Eczema - Fairly wide distribution but suspect patient has difficulty maintaining adequate hygiene living and shelter - Patient may shower as needed - Continue triamcinolone and hydrocortisone  Contamination of blood culture - given appearance of skin, high suspicion for contamination in blood cultures - repeat blood cultures 5/11 (again with 1/4 staph, suspected MSSA) - see fever workup - odd presentation and no clear skin wounds or etiology for bacteremia at this time; still favoring contamination but completing empiric course for possible cellulitis as noted above - auto-consult to ID; follow up any further recommendations    Old records reviewed in assessment of this patient  Antimicrobials: Rocephin 06/05/2022 x 1 Keflex 06/06/2022 >> 06/07/22 Ancef 06/07/22 >> current  DVT prophylaxis:  PADUA<4   Code Status:   Code Status: Full Code  Mobility Assessment (last 72 hours)     Mobility Assessment     Row Name 06/07/22 0815 06/06/22 11:50:05         Does patient have an order for bedrest or is patient medically unstable No - Continue assessment No - Continue assessment      What is the highest level of mobility based on the progressive mobility assessment? Level 6 (Walks independently in room and hall) - Balance while  walking in room without assist - Complete Level 6 (Walks independently in room and hall) - Balance while walking in room without assist - Complete               Barriers to discharge: none Disposition Plan:  Home Status is: Obs  Objective: Blood pressure 100/64, pulse 98, temperature 99.2 F (37.3 C), temperature source Oral, resp. rate 17, height 5\' 9"   (1.753 m), weight 104.3 kg, SpO2 100 %.  Examination:  Physical Exam Constitutional:      General: He is not in acute distress.    Appearance: Normal appearance.  HENT:     Head: Normocephalic and atraumatic.     Mouth/Throat:     Mouth: Mucous membranes are moist.  Eyes:     Extraocular Movements: Extraocular movements intact.  Cardiovascular:     Rate and Rhythm: Normal rate and regular rhythm.  Pulmonary:     Effort: Pulmonary effort is normal. No respiratory distress.     Breath sounds: Normal breath sounds. No wheezing.  Abdominal:     General: Bowel sounds are normal. There is no distension.     Palpations: Abdomen is soft.     Tenderness: There is no abdominal tenderness.  Musculoskeletal:        General: Normal range of motion.     Cervical back: Normal range of motion and neck supple.  Skin:    General: Skin is warm and dry.     Findings: Rash (Mildly pruritic, flaking noted, dry, mild scattered edema in extremities) present. No erythema.  Neurological:     General: No focal deficit present.     Mental Status: He is alert.  Psychiatric:        Mood and Affect: Mood normal.        Behavior: Behavior normal.      Consultants:  ID  Procedures:    Data Reviewed: Results for orders placed or performed during the hospital encounter of 06/05/22 (from the past 24 hour(s))  Basic metabolic panel     Status: Abnormal   Collection Time: 06/06/22  3:51 PM  Result Value Ref Range   Sodium 138 135 - 145 mmol/L   Potassium 4.2 3.5 - 5.1 mmol/L   Chloride 102 98 - 111 mmol/L   CO2 27 22 - 32 mmol/L   Glucose, Bld 87 70 - 99 mg/dL   BUN 8 6 - 20 mg/dL   Creatinine, Ser 1.19 0.61 - 1.24 mg/dL   Calcium 7.9 (L) 8.9 - 10.3 mg/dL   GFR, Estimated >14 >78 mL/min   Anion gap 9 5 - 15  CBC with Differential/Platelet     Status: Abnormal   Collection Time: 06/06/22  3:51 PM  Result Value Ref Range   WBC 8.2 4.0 - 10.5 K/uL   RBC 4.19 (L) 4.22 - 5.81 MIL/uL   Hemoglobin  11.5 (L) 13.0 - 17.0 g/dL   HCT 29.5 (L) 62.1 - 30.8 %   MCV 91.9 80.0 - 100.0 fL   MCH 27.4 26.0 - 34.0 pg   MCHC 29.9 (L) 30.0 - 36.0 g/dL   RDW 65.7 84.6 - 96.2 %   Platelets 327 150 - 400 K/uL   nRBC 0.0 0.0 - 0.2 %   Neutrophils Relative % 41 %   Neutro Abs 3.5 1.7 - 7.7 K/uL   Lymphocytes Relative 15 %   Lymphs Abs 1.2 0.7 - 4.0 K/uL   Monocytes Relative 22 %   Monocytes Absolute 1.8 (H)  0.1 - 1.0 K/uL   Eosinophils Relative 20 %   Eosinophils Absolute 1.6 (H) 0.0 - 0.5 K/uL   Basophils Relative 1 %   Basophils Absolute 0.1 0.0 - 0.1 K/uL   Immature Granulocytes 1 %   Abs Immature Granulocytes 0.04 0.00 - 0.07 K/uL  Procalcitonin     Status: None   Collection Time: 06/06/22  3:51 PM  Result Value Ref Range   Procalcitonin <0.10 ng/mL  Culture, blood (Routine X 2) w Reflex to ID Panel     Status: None (Preliminary result)   Collection Time: 06/06/22  3:51 PM   Specimen: BLOOD LEFT HAND  Result Value Ref Range   Specimen Description BLOOD LEFT HAND    Special Requests      BOTTLES DRAWN AEROBIC AND ANAEROBIC Blood Culture adequate volume   Culture  Setup Time      GRAM POSITIVE COCCI AEROBIC BOTTLE ONLY CRITICAL RESULT CALLED TO, READ BACK BY AND VERIFIED WITH: [facility] MADELINE MITCHELL [id number] AT 0829 BY EC Performed at [facility], [address] [place], [place] [address]    Culture GRAM POSITIVE COCCI    Report Status PENDING   Basic metabolic panel     Status: Abnormal   Collection Time: 06/07/22  3:37 AM  Result Value Ref Range   Sodium 137 135 - 145 mmol/L   Potassium 3.9 3.5 - 5.1 mmol/L   Chloride 104 98 - 111 mmol/L   CO2 26 22 - 32 mmol/L   Glucose, Bld 112 (H) 70 - 99 mg/dL   BUN 12 6 - 20 mg/dL   Creatinine, Ser 9.56 0.61 - 1.24 mg/dL   Calcium 7.9 (L) 8.9 - 10.3 mg/dL   GFR, Estimated >21 >30 mL/min   Anion gap 7 5 - 15  CBC with Differential/Platelet     Status: Abnormal   Collection Time: 06/07/22  3:37 AM  Result Value Ref Range    WBC 8.0 4.0 - 10.5 K/uL   RBC 3.92 (L) 4.22 - 5.81 MIL/uL   Hemoglobin 10.6 (L) 13.0 - 17.0 g/dL   HCT 86.5 (L) 78.4 - 69.6 %   MCV 89.8 80.0 - 100.0 fL   MCH 27.0 26.0 - 34.0 pg   MCHC 30.1 30.0 - 36.0 g/dL   RDW 29.5 28.4 - 13.2 %   Platelets 331 150 - 400 K/uL   nRBC 0.0 0.0 - 0.2 %   Neutrophils Relative % 38 %   Neutro Abs 3.1 1.7 - 7.7 K/uL   Lymphocytes Relative 17 %   Lymphs Abs 1.4 0.7 - 4.0 K/uL   Monocytes Relative 23 %   Monocytes Absolute 1.8 (H) 0.1 - 1.0 K/uL   Eosinophils Relative 21 %   Eosinophils Absolute 1.6 (H) 0.0 - 0.5 K/uL   Basophils Relative 0 %   Basophils Absolute 0.0 0.0 - 0.1 K/uL   Immature Granulocytes 1 %   Abs Immature Granulocytes 0.04 0.00 - 0.07 K/uL  Magnesium     Status: None   Collection Time: 06/07/22  3:37 AM  Result Value Ref Range   Magnesium 1.9 1.7 - 2.4 mg/dL    I have reviewed pertinent nursing notes, vitals, labs, and images as necessary. I have ordered labwork to follow up on as indicated.  I have reviewed the last notes from staff over past 24 hours. I have discussed patient's care plan and test results with nursing staff, CM/SW, and other staff as appropriate.  Time spent: Greater than  50% of the 55 minute visit was spent in counseling/coordination of care for the patient as laid out in the A&P.   LOS: 0 days   Lewie Chamber, MD [facility] 06/07/2022, 1:45 PM

## 2022-06-08 DIAGNOSIS — F1210 Cannabis abuse, uncomplicated: Secondary | ICD-10-CM

## 2022-06-08 DIAGNOSIS — R21 Rash and other nonspecific skin eruption: Secondary | ICD-10-CM

## 2022-06-08 LAB — BASIC METABOLIC PANEL
Anion gap: 8 (ref 5–15)
BUN: 9 mg/dL (ref 6–20)
CO2: 25 mmol/L (ref 22–32)
Calcium: 7.8 mg/dL — ABNORMAL LOW (ref 8.9–10.3)
Chloride: 104 mmol/L (ref 98–111)
Creatinine, Ser: 1.06 mg/dL (ref 0.61–1.24)
GFR, Estimated: >60 mL/min (ref >60)
Glucose, Bld: 100 mg/dL — ABNORMAL HIGH (ref 70–99)
Potassium: 3.9 mmol/L (ref 3.5–5.1)
Sodium: 137 mmol/L (ref 135–145)

## 2022-06-08 LAB — CBC WITH DIFFERENTIAL/PLATELET
Abs Immature Granulocytes: 0.07 10*3/uL (ref 0.00–0.07)
Basophils Absolute: 0.1 10*3/uL (ref 0.0–0.1)
Basophils Relative: 1 %
Eosinophils Absolute: 1.2 10*3/uL — ABNORMAL HIGH (ref 0.0–0.5)
Eosinophils Relative: 13 %
HCT: 36 % — ABNORMAL LOW (ref 39.0–52.0)
Hemoglobin: 11 g/dL — ABNORMAL LOW (ref 13.0–17.0)
Immature Granulocytes: 1 %
Lymphocytes Relative: 17 %
Lymphs Abs: 1.6 10*3/uL (ref 0.7–4.0)
MCH: 27.8 pg (ref 26.0–34.0)
MCHC: 30.6 g/dL (ref 30.0–36.0)
MCV: 90.9 fL (ref 80.0–100.0)
Monocytes Absolute: 1.9 10*3/uL — ABNORMAL HIGH (ref 0.1–1.0)
Monocytes Relative: 21 %
Neutro Abs: 4.5 10*3/uL (ref 1.7–7.7)
Neutrophils Relative %: 47 %
Platelets: 309 10*3/uL (ref 150–400)
RBC: 3.96 MIL/uL — ABNORMAL LOW (ref 4.22–5.81)
RDW: 14.4 % (ref 11.5–15.5)
WBC: 9.3 10*3/uL (ref 4.0–10.5)
nRBC: 0 % (ref 0.0–0.2)

## 2022-06-08 LAB — CULTURE, BLOOD (ROUTINE X 2)
Culture: NO GROWTH
Special Requests: ADEQUATE
Special Requests: ADEQUATE

## 2022-06-08 LAB — RPR: RPR Ser Ql: NONREACTIVE

## 2022-06-08 LAB — MAGNESIUM: Magnesium: 1.8 mg/dL (ref 1.7–2.4)

## 2022-06-08 NOTE — Progress Notes (Signed)
[facility]  Date of Admission:  06/05/2022   Total days of inpatient antibiotics 3  Principal Problem:   Fever Active Problems:   Contamination of blood culture   Eczema          Assessment: 41 YM admitted with:  #MSSA bacteremia suspect 2/2 skin rash #Cocaine abuse #Hx of Eczema -5/10 1/2 sets MSSA,  5//11 1/2 sets MSSA. Presented with fever, no leukocytosis -Pt states he has had diffuse rash that presents and then resolved completely. There is an ED visit for diffuse rash back in 2014.  He has not seen Derm for rash. States he had chills prior to admission. Notes he has been itching his rash -I suspect that pt may have had skin breakdown from srtching his rash leading to bacteremia -Pt states he uses cocaine, sometimes every other day. Last use a few days ago, as he did not have the funds to purchase more cocaine.   Recommendations: -Continue cefazolin -Repeat blood Cx to ensure clearance -TTE -Not a home PICC line candidate given regular cocaine use -Refer to Derm outpatient  Microbiology:   Antibiotics: cefazolin Cultures: Blood MSSA   SUBJECTIVE: Resting in bed, no new complaints Interval:  febrile Review of Systems: Review of Systems  All other systems reviewed and are negative.    Scheduled Meds:  hydrocortisone cream   Topical BID   sodium chloride flush  3 mL Intravenous Q12H   triamcinolone cream   Topical TID   Continuous Infusions:   ceFAZolin (ANCEF) IV 2 g (06/08/22 1533)   PRN Meds:.acetaminophen **OR** acetaminophen No Known Allergies  OBJECTIVE: Vitals:   06/08/22 0200 06/08/22 0749 06/08/22 1501 06/08/22 2000  BP: (!) 96/54 114/74 104/62 133/69  Pulse: 94 85 95 91  Resp: 18 18 18 18   Temp: 98.6 F (37 C) 98.5 F (36.9 C) 99.2 F (37.3 C) (!) 100.6 F (38.1 C)  TempSrc: Oral Oral Oral Oral  SpO2: 98% 100% 98% 99%  Weight:      Height:       Body mass index is 33.97 kg/m.  Physical  Exam Constitutional:      General: He is not in acute distress.    Appearance: He is normal weight. He is not toxic-appearing.  HENT:     Head: Normocephalic and atraumatic.     Right Ear: External ear normal.     Left Ear: External ear normal.     Nose: No congestion or rhinorrhea.     Mouth/Throat:     Mouth: Mucous membranes are moist.     Pharynx: Oropharynx is clear.  Eyes:     Extraocular Movements: Extraocular movements intact.     Conjunctiva/sclera: Conjunctivae normal.     Pupils: Pupils are equal, round, and reactive to light.  Cardiovascular:     Rate and Rhythm: Normal rate and regular rhythm.     Heart sounds: No murmur heard.    No friction rub. No gallop.  Pulmonary:     Effort: Pulmonary effort is normal.     Breath sounds: Normal breath sounds.  Abdominal:     General: Abdomen is flat. Bowel sounds are normal.     Palpations: Abdomen is soft.  Musculoskeletal:        General: No swelling. Normal range of motion.     Cervical back: Normal range of motion and neck supple.  Skin:    General: Skin is warm and dry.  Neurological:     General: No focal deficit present.     Mental Status: He is oriented to person, place, and time.  Psychiatric:        Mood and Affect: Mood normal.          Lab Results Lab Results  Component Value Date   WBC 9.3 06/08/2022   HGB 11.0 (L) 06/08/2022   HCT 36.0 (L) 06/08/2022   MCV 90.9 06/08/2022   PLT 309 06/08/2022    Lab Results  Component Value Date   CREATININE 1.06 06/08/2022   BUN 9 06/08/2022   NA 137 06/08/2022   K 3.9 06/08/2022   CL 104 06/08/2022   CO2 25 06/08/2022    Lab Results  Component Value Date   ALT 15 06/05/2022   AST 26 06/05/2022   ALKPHOS 54 06/05/2022   BILITOT 0.5 06/05/2022        Danelle Earthly, MD [facility] 06/08/2022, 10:51 PM   I have personally spent 52 minutes involved in face-to-face and non-face-to-face  activities for this patient on the day of the visit. Professional time spent includes the following activities: Preparing to see the patient (review of tests), Obtaining and/or reviewing separately obtained history (admission/discharge record), Performing a medically appropriate examination and/or evaluation , Ordering medications/tests/procedures, referring and communicating with other health care professionals, Documenting clinical information in the EMR, Independently interpreting results (not separately reported), Communicating results to the patient/family/caregiver, Counseling and educating the patient/family/caregiver and Care coordination (not separately reported).

## 2022-06-08 NOTE — Progress Notes (Signed)
Progress Note    Billy Ward   ZOX:[id number]  DOB: 04/01/1980  DOA: 06/05/2022     1 PCP: Pcp, No  Initial CC: fever  Hospital Course: Billy Ward is a 42 yo male with PMH eczema who presented with chills, cough, body aches, and leg swelling.  He felt that his eczema has been worsening and was having some flaking of his skin which typically happens. He was seen recently in the ER on 05/29/2022 for right ear pain and was noted to have ongoing skin changes at that time.  He was discharged with a prescription of Kenalog and ear exam was benign and reassuring.  Due to some reported hearing loss at the time, he was recommended to follow-up with ENT. On workup, he was felt to have a possible underlying component of cellulitis associated with his eczema.  He was started on antibiotics and blood cultures were also obtained. Due to the diffuse distribution, initially request for transfer to a tertiary center was pursued.  Upon further observation and monitoring after antibiotics, he appeared to be clinically improved and he was admitted to the hospital for further treatment and monitoring.  Interval History:  No events overnight.  Still feeling better today.  No further fevers, sweats, chills.  Skin also improving after he continues to shower.  Assessment and Plan: * Fever - Possible component of cellulitis although no specific localization on his body.  He has untreated diffuse eczema and likely poor skin hygiene given social situation (lives in a shelter) - Tmax so far 100.4; no leukocytosis; normal lactic; non-toxic appearing  - started on Rocephin on admission - unclear etiology; would favor de-escalate to keflex and finish short empiric course (changed to Ancef on 5/12 per ID until able to be evaluated) -Remains afebrile with no leukocytosis.  Procalcitonin also negative initially  AKI (acute kidney injury) (HCC)-resolved as of 06/07/2022 - baseline creatinine ~ u/k (presumed normal) -  patient presents with increase in creat >0.3 mg/dL above baseline, creat increase >1.5x baseline presumed to have occurred within past 7 days PTA - suspect some insensible losses and overall dehydration - s/p LR bolus in ER -Renal function normalized with fluids   Eczema - Fairly wide distribution but suspect patient has difficulty maintaining adequate hygiene living and shelter - Patient may shower as needed - Continue triamcinolone and hydrocortisone  Contamination of blood culture - given appearance of skin, high suspicion for contamination in blood cultures - repeat blood cultures 5/11 (again with 1/4 staph, suspected MSSA) - see fever workup - odd presentation and no clear skin wounds or etiology for bacteremia at this time; still favoring contamination but completing empiric course for possible cellulitis as noted above - auto-consult to ID; follow up any further recommendations    Old records reviewed in assessment of this patient  Antimicrobials: Rocephin 06/05/2022 x 1 Keflex 06/06/2022 >> 06/07/22 Ancef 06/07/22 >> current  DVT prophylaxis:  PADUA<4   Code Status:   Code Status: Full Code  Mobility Assessment (last 72 hours)     Mobility Assessment     Row Name 06/07/22 0815 06/06/22 11:50:05         Does patient have an order for bedrest or is patient medically unstable No - Continue assessment No - Continue assessment      What is the highest level of mobility based on the progressive mobility assessment? Level 6 (Walks independently in room and hall) - Balance while walking in room without assist - Complete Level 6 (  Walks independently in room and hall) - Balance while walking in room without assist - Complete               Barriers to discharge: none Disposition Plan:  Home Status is: Inpt  Objective: Blood pressure 114/74, pulse 85, temperature 98.5 F (36.9 C), temperature source Oral, resp. rate 18, height 5\' 9"  (1.753 m), weight 104.3 kg, SpO2 100  %.  Examination:  Physical Exam Constitutional:      General: He is not in acute distress.    Appearance: Normal appearance.  HENT:     Head: Normocephalic and atraumatic.     Mouth/Throat:     Mouth: Mucous membranes are moist.  Eyes:     Extraocular Movements: Extraocular movements intact.  Cardiovascular:     Rate and Rhythm: Normal rate and regular rhythm.  Pulmonary:     Effort: Pulmonary effort is normal. No respiratory distress.     Breath sounds: Normal breath sounds. No wheezing.  Abdominal:     General: Bowel sounds are normal. There is no distension.     Palpations: Abdomen is soft.     Tenderness: There is no abdominal tenderness.  Musculoskeletal:        General: Normal range of motion.     Cervical back: Normal range of motion and neck supple.  Skin:    General: Skin is warm and dry.     Findings: Rash (Mildly pruritic, flaking noted, dry, mild scattered edema in extremities  (IMPROVED)) present. No erythema.  Neurological:     General: No focal deficit present.     Mental Status: He is alert.  Psychiatric:        Mood and Affect: Mood normal.        Behavior: Behavior normal.      Consultants:  ID  Procedures:    Data Reviewed: Results for orders placed or performed during the hospital encounter of 06/05/22 (from the past 24 hour(s))  Basic metabolic panel     Status: Abnormal   Collection Time: 06/08/22  6:34 AM  Result Value Ref Range   Sodium 137 135 - 145 mmol/L   Potassium 3.9 3.5 - 5.1 mmol/L   Chloride 104 98 - 111 mmol/L   CO2 25 22 - 32 mmol/L   Glucose, Bld 100 (H) 70 - 99 mg/dL   BUN 9 6 - 20 mg/dL   Creatinine, Ser 1.61 0.61 - 1.24 mg/dL   Calcium 7.8 (L) 8.9 - 10.3 mg/dL   GFR, Estimated >09 >60 mL/min   Anion gap 8 5 - 15  CBC with Differential/Platelet     Status: Abnormal   Collection Time: 06/08/22  6:34 AM  Result Value Ref Range   WBC 9.3 4.0 - 10.5 K/uL   RBC 3.96 (L) 4.22 - 5.81 MIL/uL   Hemoglobin 11.0 (L) 13.0 - 17.0  g/dL   HCT 45.4 (L) 09.8 - 11.9 %   MCV 90.9 80.0 - 100.0 fL   MCH 27.8 26.0 - 34.0 pg   MCHC 30.6 30.0 - 36.0 g/dL   RDW 14.7 82.9 - 56.2 %   Platelets 309 150 - 400 K/uL   nRBC 0.0 0.0 - 0.2 %   Neutrophils Relative % 47 %   Neutro Abs 4.5 1.7 - 7.7 K/uL   Lymphocytes Relative 17 %   Lymphs Abs 1.6 0.7 - 4.0 K/uL   Monocytes Relative 21 %   Monocytes Absolute 1.9 (H) 0.1 - 1.0 K/uL   Eosinophils  Relative 13 %   Eosinophils Absolute 1.2 (H) 0.0 - 0.5 K/uL   Basophils Relative 1 %   Basophils Absolute 0.1 0.0 - 0.1 K/uL   Immature Granulocytes 1 %   Abs Immature Granulocytes 0.07 0.00 - 0.07 K/uL  Magnesium     Status: None   Collection Time: 06/08/22  6:34 AM  Result Value Ref Range   Magnesium 1.8 1.7 - 2.4 mg/dL    I have reviewed pertinent nursing notes, vitals, labs, and images as necessary. I have ordered labwork to follow up on as indicated.  I have reviewed the last notes from staff over past 24 hours. I have discussed patient's care plan and test results with nursing staff, CM/SW, and other staff as appropriate.    LOS: 1 day   Lewie Chamber, MD [facility] 06/08/2022, 12:59 PM

## 2022-06-09 ENCOUNTER — Inpatient Hospital Stay (HOSPITAL_COMMUNITY): Payer: BC Managed Care – PPO

## 2022-06-09 DIAGNOSIS — R7881 Bacteremia: Secondary | ICD-10-CM

## 2022-06-09 DIAGNOSIS — B9561 Methicillin susceptible Staphylococcus aureus infection as the cause of diseases classified elsewhere: Secondary | ICD-10-CM

## 2022-06-09 LAB — CBC WITH DIFFERENTIAL/PLATELET
Abs Immature Granulocytes: 0.11 10*3/uL — ABNORMAL HIGH (ref 0.00–0.07)
Basophils Absolute: 0.1 10*3/uL (ref 0.0–0.1)
Basophils Relative: 0 %
Eosinophils Absolute: 1 10*3/uL — ABNORMAL HIGH (ref 0.0–0.5)
Eosinophils Relative: 8 %
HCT: 35.6 % — ABNORMAL LOW (ref 39.0–52.0)
Hemoglobin: 10.8 g/dL — ABNORMAL LOW (ref 13.0–17.0)
Immature Granulocytes: 1 %
Lymphocytes Relative: 15 %
Lymphs Abs: 1.9 10*3/uL (ref 0.7–4.0)
MCH: 27.1 pg (ref 26.0–34.0)
MCHC: 30.3 g/dL (ref 30.0–36.0)
MCV: 89.4 fL (ref 80.0–100.0)
Monocytes Absolute: 2.2 10*3/uL — ABNORMAL HIGH (ref 0.1–1.0)
Monocytes Relative: 18 %
Neutro Abs: 6.9 10*3/uL (ref 1.7–7.7)
Neutrophils Relative %: 58 %
Platelets: 312 10*3/uL (ref 150–400)
RBC: 3.98 MIL/uL — ABNORMAL LOW (ref 4.22–5.81)
RDW: 14.4 % (ref 11.5–15.5)
WBC: 12.3 10*3/uL — ABNORMAL HIGH (ref 4.0–10.5)
nRBC: 0 % (ref 0.0–0.2)

## 2022-06-09 LAB — CULTURE, BLOOD (ROUTINE X 2): Special Requests: ADEQUATE

## 2022-06-09 NOTE — Progress Notes (Signed)
Progress Note    Billy Ward   UJW:[id number]  DOB: 1980-08-02  DOA: 06/05/2022     2 PCP: Pcp, No  Initial CC: fever  Hospital Course: Billy Ward is a 42 yo male with PMH eczema who presented with chills, cough, body aches, and leg swelling.  He felt that his eczema has been worsening and was having some flaking of his skin which typically happens. He was seen recently in the ER on 05/29/2022 for right ear pain and was noted to have ongoing skin changes at that time.  He was discharged with a prescription of Kenalog and ear exam was benign and reassuring.  Due to some reported hearing loss at the time, he was recommended to follow-up with ENT. On workup, he was felt to have a possible underlying component of cellulitis associated with his eczema.  He was started on antibiotics and blood cultures were also obtained. Due to the diffuse distribution, initially request for transfer to a tertiary center was pursued.  Upon further observation and monitoring after antibiotics, he appeared to be clinically improved and he was admitted to the hospital for further treatment and monitoring.  Interval History:  No events overnight.  Resting in bed when seen this morning.  Low-grade fever noted overnight but he was unaware.  Assessment and Plan: * Fever - Possible component of cellulitis although no specific localization on his body.  He has untreated diffuse eczema and likely poor skin hygiene given social situation (lives in a shelter) - Tmax so far 100.4; no leukocytosis; normal lactic; non-toxic appearing  - started on Rocephin on admission - unclear etiology, possibly from bacteremia -Remains afebrile with no leukocytosis.  Procalcitonin also negative initially - ID following  MSSA bacteremia - given appearance of skin, differential for etiology included contamination versus translocation from skin -ID following and treating as real bacteremia at this time - Follow-up repeat blood  cultures - Continue Ancef per ID - Follow-up echo results  AKI (acute kidney injury) (HCC)-resolved as of 06/07/2022 - baseline creatinine ~ u/k (presumed normal) - patient presents with increase in creat >0.3 mg/dL above baseline, creat increase >1.5x baseline presumed to have occurred within past 7 days PTA - suspect some insensible losses and overall dehydration - s/p LR bolus in ER -Renal function normalized with fluids   Eczema - Fairly wide distribution but suspect patient has difficulty maintaining adequate hygiene living and shelter - Patient may shower as needed - Continue triamcinolone and hydrocortisone   Old records reviewed in assessment of this patient  Antimicrobials: Rocephin 06/05/2022 x 1 Keflex 06/06/2022 >> 06/07/22 Ancef 06/07/22 >> current  DVT prophylaxis:  PADUA<4   Code Status:   Code Status: Full Code  Mobility Assessment (last 72 hours)     Mobility Assessment     Row Name 06/09/22 0840 06/07/22 0815         Does patient have an order for bedrest or is patient medically unstable No - Continue assessment No - Continue assessment      What is the highest level of mobility based on the progressive mobility assessment? Level 6 (Walks independently in room and hall) - Balance while walking in room without assist - Complete Level 6 (Walks independently in room and hall) - Balance while walking in room without assist - Complete               Barriers to discharge: none Disposition Plan:  Home Status is: Inpt  Objective: Blood pressure 105/76, pulse 75,  temperature 98.2 F (36.8 C), temperature source Oral, resp. rate 16, height 5\' 9"  (1.753 m), weight 104.3 kg, SpO2 100 %.  Examination:  Physical Exam Constitutional:      General: He is not in acute distress.    Appearance: Normal appearance.  HENT:     Head: Normocephalic and atraumatic.     Mouth/Throat:     Mouth: Mucous membranes are moist.  Eyes:     Extraocular Movements:  Extraocular movements intact.  Cardiovascular:     Rate and Rhythm: Normal rate and regular rhythm.  Pulmonary:     Effort: Pulmonary effort is normal. No respiratory distress.     Breath sounds: Normal breath sounds. No wheezing.  Abdominal:     General: Bowel sounds are normal. There is no distension.     Palpations: Abdomen is soft.     Tenderness: There is no abdominal tenderness.  Musculoskeletal:        General: Normal range of motion.     Cervical back: Normal range of motion and neck supple.  Skin:    General: Skin is warm and dry.     Findings: Rash (Mildly pruritic, flaking noted, dry, mild scattered edema in extremities  (IMPROVED)) present. No erythema.  Neurological:     General: No focal deficit present.     Mental Status: He is alert.  Psychiatric:        Mood and Affect: Mood normal.        Behavior: Behavior normal.      Consultants:  ID  Procedures:    Data Reviewed: Results for orders placed or performed during the hospital encounter of 06/05/22 (from the past 24 hour(s))  CBC with Differential/Platelet     Status: Abnormal   Collection Time: 06/09/22  3:35 AM  Result Value Ref Range   WBC 12.3 (H) 4.0 - 10.5 K/uL   RBC 3.98 (L) 4.22 - 5.81 MIL/uL   Hemoglobin 10.8 (L) 13.0 - 17.0 g/dL   HCT 16.1 (L) 09.6 - 04.5 %   MCV 89.4 80.0 - 100.0 fL   MCH 27.1 26.0 - 34.0 pg   MCHC 30.3 30.0 - 36.0 g/dL   RDW 40.9 81.1 - 91.4 %   Platelets 312 150 - 400 K/uL   nRBC 0.0 0.0 - 0.2 %   Neutrophils Relative % 58 %   Neutro Abs 6.9 1.7 - 7.7 K/uL   Lymphocytes Relative 15 %   Lymphs Abs 1.9 0.7 - 4.0 K/uL   Monocytes Relative 18 %   Monocytes Absolute 2.2 (H) 0.1 - 1.0 K/uL   Eosinophils Relative 8 %   Eosinophils Absolute 1.0 (H) 0.0 - 0.5 K/uL   Basophils Relative 0 %   Basophils Absolute 0.1 0.0 - 0.1 K/uL   Immature Granulocytes 1 %   Abs Immature Granulocytes 0.11 (H) 0.00 - 0.07 K/uL    I have reviewed pertinent nursing notes, vitals, labs, and  images as necessary. I have ordered labwork to follow up on as indicated.  I have reviewed the last notes from staff over past 24 hours. I have discussed patient's care plan and test results with nursing staff, CM/SW, and other staff as appropriate.    LOS: 2 days   Lewie Chamber, MD [facility] 06/09/2022, 4:51 PM

## 2022-06-09 NOTE — TOC Initial Note (Signed)
[facility]) - Initial/Assessment Note    Patient Details  Name: Billy Ward MRN: [id number] Date of Birth: Jul 06, 1980  [facility]) CM/SW Contact:    Harriet Masson, RN Phone Number: 06/09/2022, 3:12 PM  Clinical Narrative:                 Spoke to patient regarding PCP and SA. Patient declines for TOC to make him a PCP apt and declines SA resources.  Patient states he will need bus pass at discharge.   TOC following.   Expected Discharge Plan: Home/Self Care Barriers to Discharge: Continued Medical Work up   Patient Goals and CMS Choice Patient states their goals for this hospitalization and ongoing recovery are:: return home          Expected Discharge Plan and Services       Living arrangements for the past 2 months: Apartment                                      Prior Living Arrangements/Services Living arrangements for the past 2 months: Apartment                     Activities of Daily Living Home Assistive Devices/Equipment: None ADL Screening (condition at time of admission) Patient's cognitive ability adequate to safely complete daily activities?: Yes Is the patient deaf or have difficulty hearing?: No Does the patient have difficulty seeing, even when wearing glasses/contacts?: No Does the patient have difficulty concentrating, remembering, or making decisions?: No Patient able to express need for assistance with ADLs?: Yes Does the patient have difficulty dressing or bathing?: No Independently performs ADLs?: Yes (appropriate for developmental age) Does the patient have difficulty walking or climbing stairs?: No Weakness of Legs: None Weakness of Arms/Hands: None  Permission Sought/Granted                  Emotional Assessment              Admission diagnosis:  Fever [R50.9] Cellulitis of lower extremity, unspecified laterality [L03.119] Eczema, unspecified type [L30.9] Sepsis, due to  unspecified organism, unspecified whether acute organ dysfunction present [facility]) [A41.9] Patient Active Problem List   Diagnosis Date Noted   Fever 06/06/2022   Contamination of blood culture 06/06/2022   Eczema 06/06/2022   PCP:  Pcp, No Pharmacy:   [id number] - [place], [place] - [address] [place] [address] Phone: [phone] Fax: [phone]     Social Determinants of Health (SDOH) Social History: SDOH Screenings   Food Insecurity: No Food Insecurity (06/06/2022)  Housing: Low Risk  (06/06/2022)  Transportation Needs: Unmet Transportation Needs (06/06/2022)  Utilities: At Risk (06/06/2022)  Tobacco Use: High Risk (06/06/2022)   SDOH Interventions:     Readmission Risk Interventions     No data to display

## 2022-06-10 ENCOUNTER — Inpatient Hospital Stay (HOSPITAL_COMMUNITY): Payer: BC Managed Care – PPO

## 2022-06-10 DIAGNOSIS — I38 Endocarditis, valve unspecified: Secondary | ICD-10-CM

## 2022-06-10 LAB — CBC WITH DIFFERENTIAL/PLATELET
Abs Immature Granulocytes: 0.15 10*3/uL — ABNORMAL HIGH (ref 0.00–0.07)
Basophils Absolute: 0.1 10*3/uL (ref 0.0–0.1)
Basophils Relative: 1 %
Eosinophils Absolute: 1.2 10*3/uL — ABNORMAL HIGH (ref 0.0–0.5)
Eosinophils Relative: 9 %
HCT: 38 % — ABNORMAL LOW (ref 39.0–52.0)
Hemoglobin: 11.6 g/dL — ABNORMAL LOW (ref 13.0–17.0)
Immature Granulocytes: 1 %
Lymphocytes Relative: 14 %
Lymphs Abs: 1.8 10*3/uL (ref 0.7–4.0)
MCH: 27.2 pg (ref 26.0–34.0)
MCHC: 30.5 g/dL (ref 30.0–36.0)
MCV: 89 fL (ref 80.0–100.0)
Monocytes Absolute: 2.2 10*3/uL — ABNORMAL HIGH (ref 0.1–1.0)
Monocytes Relative: 18 %
Neutro Abs: 7.2 10*3/uL (ref 1.7–7.7)
Neutrophils Relative %: 57 %
Platelets: 340 10*3/uL (ref 150–400)
RBC: 4.27 MIL/uL (ref 4.22–5.81)
RDW: 14.4 % (ref 11.5–15.5)
WBC: 12.6 10*3/uL — ABNORMAL HIGH (ref 4.0–10.5)
nRBC: 0 % (ref 0.0–0.2)

## 2022-06-10 LAB — ECHOCARDIOGRAM COMPLETE
AR max vel: 2.89 cm2
AV Area VTI: 2.82 cm2
AV Area mean vel: 2.97 cm2
AV Mean grad: 3 mmHg
AV Peak grad: 6.4 mmHg
Ao pk vel: 1.26 m/s
Area-P 1/2: 2.75 cm2
Height: 69 in
S' Lateral: 3.6 cm
Weight: 3680 oz

## 2022-06-10 LAB — CULTURE, BLOOD (ROUTINE X 2)

## 2022-06-10 NOTE — Progress Notes (Signed)
  Echocardiogram 2D Echocardiogram has been performed.  Billy Ward 06/10/2022, 2:28 PM

## 2022-06-10 NOTE — Progress Notes (Signed)
[facility]  Date of Admission:  06/05/2022   Total days of inpatient antibiotics 3  Principal Problem:   Fever Active Problems:   MSSA bacteremia   Eczema          Assessment: Billy Ward admitted with:  #MSSA bacteremia suspect 2/2 skin rash #Cocaine abuse #Hx of Eczema -Presented with fever, no leukocytosis -5/10 1/2 sets MSSA at 2200,  5/11 1/2 sets MSSA around 1550.  Note, patient received 1 dose of ceftriaxone between 5/10 and 5/11 blood cultures.   -Pt states he has had diffuse rash that presents and then resolved completely. There is an ED visit for diffuse rash back in 2014.  He has not seen Derm for rash. States he had chills prior to admission. Notes he has been itching his rash -I suspect that pt may have had skin breakdown from scratching his rash leading to bacteremia. Ras his improving, pt on triamcinolone cream -Pt states he uses cocaine, sometimes every other day. Last use a few days ago, as he did not have the funds to purchase more cocaine.   Recommendations: -Continue cefazolin -Repeat blood Cx from 5/13 remain clear -TTE pending, hopefully today  -Not a home PICC line candidate given regular cocaine use -Refer to Derm outpatient  Microbiology:   Antibiotics: cefazolin Cultures: Blood 5/10 1/2 MSSA  5/11 1/2 sets MSSA. 5/13 NG SUBJECTIVE: Resting in bed, no new complaints. Inquiring about next steps Interval: afebrile Review of Systems: Review of Systems  All other systems reviewed and are negative.    Scheduled Meds:  hydrocortisone cream   Topical BID   sodium chloride flush  3 mL Intravenous Q12H   triamcinolone cream   Topical TID   Continuous Infusions:   ceFAZolin (ANCEF) IV 2 g (06/10/22 0819)   PRN Meds:.acetaminophen **OR** acetaminophen No Known Allergies  OBJECTIVE: Vitals:   06/09/22 1655 06/09/22 2142 06/10/22 0600 06/10/22 0813  BP: 119/76 106/67 122/72 (!) 113/58  Pulse: 92 98 89 78   Resp: 16 18 19    Temp: 100 F (37.8 C) 99.3 F (37.4 C)  99 F (37.2 C)  TempSrc: Oral Oral    SpO2: 100% 100% 100% 100%  Weight:      Height:       Body mass index is 33.97 kg/m.  Physical Exam Constitutional:      General: He is not in acute distress.    Appearance: He is normal weight. He is not toxic-appearing.  HENT:     Head: Normocephalic and atraumatic.     Right Ear: External ear normal.     Left Ear: External ear normal.     Nose: No congestion or rhinorrhea.     Mouth/Throat:     Mouth: Mucous membranes are moist.     Pharynx: Oropharynx is clear.  Eyes:     Extraocular Movements: Extraocular movements intact.     Conjunctiva/sclera: Conjunctivae normal.     Pupils: Pupils are equal, round, and reactive to light.  Cardiovascular:     Rate and Rhythm: Normal rate and regular rhythm.     Heart sounds: No murmur heard.    No friction rub. No gallop.  Pulmonary:     Effort: Pulmonary effort is normal.     Breath sounds: Normal breath sounds.  Abdominal:     General: Abdomen is flat. Bowel sounds are normal.     Palpations: Abdomen is soft.  Musculoskeletal:  General: No swelling. Normal range of motion.     Cervical back: Normal range of motion and neck supple.  Skin:    General: Skin is warm and dry.  Neurological:     General: No focal deficit present.     Mental Status: He is oriented to person, place, and time.  Psychiatric:        Mood and Affect: Mood normal.          Lab Results Lab Results  Component Value Date   WBC 12.6 (H) 06/10/2022   HGB 11.6 (L) 06/10/2022   HCT 38.0 (L) 06/10/2022   MCV 89.0 06/10/2022   PLT 340 06/10/2022    Lab Results  Component Value Date   CREATININE 1.06 06/08/2022   BUN 9 06/08/2022   NA 137 06/08/2022   K 3.9 06/08/2022   CL 104 06/08/2022   CO2 25 06/08/2022    Lab Results  Component Value Date   ALT 15 06/05/2022   AST 26 06/05/2022   ALKPHOS 54 06/05/2022   BILITOT 0.5 06/05/2022         Danelle Earthly, MD [facility] 06/10/2022, 12:54 PM   I have personally spent 51 minutes involved in face-to-face and non-face-to-face activities for this patient on the day of the visit. Professional time spent includes the following activities: Preparing to see the patient (review of tests), Obtaining and/or reviewing separately obtained history (admission/discharge record), Performing a medically appropriate examination and/or evaluation , Ordering medications/tests/procedures, referring and communicating with other health care professionals, Documenting clinical information in the EMR, Independently interpreting results (not separately reported), Communicating results to the patient/family/caregiver, Counseling and educating the patient/family/caregiver and Care coordination (not separately reported).

## 2022-06-10 NOTE — Progress Notes (Signed)
PROGRESS NOTE  Billy Ward  DOB: 24-Nov-1980  PCP: Aviva Kluver ZOX:[id number]  DOA: 06/05/2022  LOS: 3 days  Hospital Day: 6  Brief narrative: Billy Ward is a 42 y.o. male with PMH significant for eczema, cocaine abuse who lives in a homeless shelter. 5/3, he was recently seen in the ED for right ear pain and was noted to have ongoing skin changes at that time.  Ear exam was benign and reassuring and he was discharged with a prescription of Kenalog. Due to some reported hearing loss at the time, he was recommended to follow-up with ENT. 5/10, patient presented with chills, cough, body aches, leg swelling and flaking off of skin which typically happens for him when eczema flares up.  On workup, he was felt to have a possible cellulitis associated with his eczema.   He was started on antibiotics Blood cultures were sent. Due to the diffuse distribution, initially request for transfer to a tertiary center was pursued but was not successful because of no bed availability.   Admitted to [facility] on IV antibiotics  Blood culture sent on admission grew MSSA ID consulted  Subjective: Patient was seen and examined this morning.  Pleasant young African-American male.  Lying on bed.  Not in distress.  No new symptoms.  Pending TTE. Chart reviewed. In the last 24 hours, Tmax 100 at 5 PM yesterday. Last set of labs from this morning with WC count 12.6  Assessment and plan: MSSA bacteremia Eczema Patient presented with fever with no leukocytosis.   Blood culture sent admission grew MSSA.   Patient reports that he had diffuse eczema which quickly resolved.  I suspect that patient may have had a skin breakdown from scratching his rash leading to bacteremia.  He has poor skin hygiene given social situation (lives in a shelter) Currently on IV cefazolin per ID recommendation Repeat blood culture sent on 5/30 and has not shown any growth. Pending TTE.  Not a candidate for PICC line because of regular  drug abuse Continue topical triamcinolone and hydrocortisone for eczema.  Needs to see dermatology as an outpatient. Recent Labs  Lab 06/05/22 2200 06/06/22 0027 06/06/22 1551 06/07/22 0337 06/08/22 0634 06/09/22 0335 06/10/22 0625  WBC  --   --  8.2 8.0 9.3 12.3* 12.6*  LATICACIDVEN 1.3 1.3  --   --   --   --   --   PROCALCITON  --   --  <0.10  --   --   --   --     AKI (acute kidney injury)  Resolved with IV hydration Recent Labs    06/05/22 1853 06/06/22 1551 06/07/22 0337 06/08/22 0634  BUN 10 8 12 9   CREATININE 1.34* 1.17 1.13 1.06   H/o cocaine use Counseled to quit   Mobility: Encourage ambulation  Goals of care   Code Status: Full Code     DVT prophylaxis: SCDs Place and maintain sequential compression device Start: 06/10/22 0920   Antimicrobials: Currently on IV Ancef Fluid: None Consultants: ID Family Communication: None at bedside  Status: Patient Level of care:  Med-Surg   Patient from: Homeless shelter Anticipated d/c to: Probably back to homeless shelter in 1 to 2 days Needs to continue in-hospital care:  Pending TTE.  Remains on IV antibiotics   Diet:  Diet Order             Diet regular Room service appropriate? Yes; Fluid consistency: Thin  Diet effective now  Scheduled Meds:  hydrocortisone cream   Topical BID   sodium chloride flush  3 mL Intravenous Q12H   triamcinolone cream   Topical TID    PRN meds: acetaminophen **OR** acetaminophen   Infusions:    ceFAZolin (ANCEF) IV 2 g (06/10/22 0819)    Antimicrobials: Anti-infectives (From admission, onward)    Start     Dose/Rate Route Frequency Ordered Stop   06/07/22 1400  ceFAZolin (ANCEF) IVPB 2g/100 mL premix        2 g 200 mL/hr over 30 Minutes Intravenous Every 8 hours 06/07/22 1107     06/06/22 2200  cephALEXin (KEFLEX) capsule 500 mg  Status:  Discontinued        500 mg Oral 2 times daily 06/06/22 1514 06/07/22 1107   06/06/22 1545   ceFAZolin (ANCEF) IVPB 2g/100 mL premix  Status:  Discontinued        2 g 200 mL/hr over 30 Minutes Intravenous Every 8 hours 06/06/22 1449 06/06/22 1514   06/05/22 2200  cefTRIAXone (ROCEPHIN) 2 g in sodium chloride 0.9 % 100 mL IVPB  Status:  Discontinued        2 g 200 mL/hr over 30 Minutes Intravenous Every 24 hours 06/05/22 2158 06/06/22 1449       Nutritional status:  Body mass index is 33.97 kg/m.          Objective: Vitals:   06/10/22 0600 06/10/22 0813  BP: 122/72 (!) 113/58  Pulse: 89 78  Resp: 19   Temp:  99 F (37.2 C)  SpO2: 100% 100%   No intake or output data in the 24 hours ending 06/10/22 0919 Filed Weights   06/05/22 1828  Weight: 104.3 kg   Weight change:  Body mass index is 33.97 kg/m.   Physical Exam: General exam: Pleasant, young African-American male.  Not in distress Skin: No rashes, lesions or ulcers.  No evidence of eczema currently HEENT: Atraumatic, normocephalic, no obvious bleeding Lungs: Clear to auscultation bilaterally CVS: Regular rate and rhythm, no murmur GI/Abd soft, nontender, nondistended, bowel sound present CNS: Alert, awake, oriented x 3 Psychiatry: Mood appropriate Extremities: No pedal edema, no calf tenderness  Data Review: I have personally reviewed the laboratory data and studies available.  F/u labs ordered Unresulted Labs (From admission, onward)     Start     Ordered   06/07/22 0500  CBC with Differential/Platelet  Daily,   R     Question:  Specimen collection method  Answer:  Lab=Lab collect   06/06/22 1827            Total time spent in review of labs and imaging, patient evaluation, formulation of plan, documentation and communication with family: 45 minutes  Signed, Lorin Glass, MD [facility] 06/10/2022

## 2022-06-10 NOTE — Plan of Care (Signed)
A/ox4 and on room air. No complaints of pain this shift. Remains on IV ancef. Up with self care. Patient self applies ointments. No overnight issues.    Problem: Education: Goal: Knowledge of General Education information will improve Description: Including pain rating scale, medication(s)/side effects and non-pharmacologic comfort measures Outcome: Progressing   Problem: Health Behavior/Discharge Planning: Goal: Ability to manage health-related needs will improve Outcome: Progressing   Problem: Clinical Measurements: Goal: Ability to maintain clinical measurements within normal limits will improve Outcome: Progressing Goal: Will remain free from infection Outcome: Progressing Goal: Diagnostic test results will improve Outcome: Progressing Goal: Respiratory complications will improve Outcome: Progressing Goal: Cardiovascular complication will be avoided Outcome: Progressing   Problem: Activity: Goal: Risk for activity intolerance will decrease Outcome: Progressing   Problem: Nutrition: Goal: Adequate nutrition will be maintained Outcome: Progressing   Problem: Coping: Goal: Level of anxiety will decrease Outcome: Progressing   Problem: Elimination: Goal: Will not experience complications related to bowel motility Outcome: Progressing Goal: Will not experience complications related to urinary retention Outcome: Progressing   Problem: Pain Managment: Goal: General experience of comfort will improve Outcome: Progressing   Problem: Safety: Goal: Ability to remain free from injury will improve Outcome: Progressing   Problem: Skin Integrity: Goal: Risk for impaired skin integrity will decrease Outcome: Progressing

## 2022-06-11 ENCOUNTER — Other Ambulatory Visit: Payer: Self-pay | Admitting: Internal Medicine

## 2022-06-11 LAB — CBC WITH DIFFERENTIAL/PLATELET
Abs Immature Granulocytes: 0.17 10*3/uL — ABNORMAL HIGH (ref 0.00–0.07)
Basophils Absolute: 0.1 10*3/uL (ref 0.0–0.1)
Basophils Relative: 1 %
Eosinophils Absolute: 1.8 10*3/uL — ABNORMAL HIGH (ref 0.0–0.5)
Eosinophils Relative: 16 %
HCT: 38 % — ABNORMAL LOW (ref 39.0–52.0)
Hemoglobin: 11.7 g/dL — ABNORMAL LOW (ref 13.0–17.0)
Immature Granulocytes: 2 %
Lymphocytes Relative: 15 %
Lymphs Abs: 1.6 10*3/uL (ref 0.7–4.0)
MCH: 27.8 pg (ref 26.0–34.0)
MCHC: 30.8 g/dL (ref 30.0–36.0)
MCV: 90.3 fL (ref 80.0–100.0)
Monocytes Absolute: 1.7 10*3/uL — ABNORMAL HIGH (ref 0.1–1.0)
Monocytes Relative: 16 %
Neutro Abs: 5.4 10*3/uL (ref 1.7–7.7)
Neutrophils Relative %: 50 %
Platelets: 353 10*3/uL (ref 150–400)
RBC: 4.21 MIL/uL — ABNORMAL LOW (ref 4.22–5.81)
RDW: 14.4 % (ref 11.5–15.5)
WBC: 10.7 10*3/uL — ABNORMAL HIGH (ref 4.0–10.5)
nRBC: 0 % (ref 0.0–0.2)

## 2022-06-11 LAB — CULTURE, BLOOD (ROUTINE X 2)
Culture: NO GROWTH
Special Requests: ADEQUATE

## 2022-06-11 MED ORDER — ORITAVANCIN DIPHOSPHATE 400 MG IV SOLR: 1200.0000 mg | Freq: Once | INTRAVENOUS | Status: DC

## 2022-06-11 NOTE — Plan of Care (Signed)

## 2022-06-11 NOTE — Progress Notes (Signed)
[facility]  Date of Admission:  06/05/2022   Total days of inpatient antibiotics 7  Principal Problem:   Fever Active Problems:   MSSA bacteremia   Eczema          Assessment: 41 YM admitted with:  #MSSA bacteremia suspect 2/2 skin rash #Cocaine abuse #Hx of Eczema -Presented with fever, no leukocytosis -5/10 1/2 sets MSSA at 2200,  5/11 1/2 sets MSSA around 1550.  Note, patient received 1 dose of ceftriaxone between 5/10 and 5/11 blood cultures.   -Pt states he has had diffuse rash that presents and then resolved completely. There is an ED visit for diffuse rash back in 2014.  He has not seen Derm for rash. States he had chills prior to admission. Notes he had been itching his rash -I suspect that pt may have had skin breakdown from scratching his rash leading to bacteremia. Rash is improving, pt on triamcinolone cream -Pt states he uses cocaine, sometimes every other day. Last use a few days ago, as he did not have the funds to purchase more cocaine.  -TTE was negative Recommendations: -Patient's blood cultures cleared pretty quickly on 5/13, although 1 dose of ceftriaxone was given between 510- 5/11 this is not optimal antibiotic for MSSA.  TTE was negative.  Given that blood cultures cleared after appropriate antibiotics, TTE was negative and patient is clinically stable no need for TEE at this time.  Treat as  simple MSSA bacteremia with 2 weeks of IV therapy from negative cultures. - Given active cocaine use, patient is not home PICC line candidate.  Communicated antibiotic plan to patient. - ID will sign off, follow-up with ID following discharge - Needs dermatology referral    Microbiology:   Antibiotics: cefazolin Cultures: Blood 5/10 1/2 MSSA  5/11 1/2 sets MSSA. 5/13 NG SUBJECTIVE: Resting in bed, he had no new complaints. Interval: afebrile overnight Review of Systems: Review of Systems  All other systems reviewed and  are negative.    Scheduled Meds:  hydrocortisone cream   Topical BID   sodium chloride flush  3 mL Intravenous Q12H   triamcinolone cream   Topical TID   Continuous Infusions:   ceFAZolin (ANCEF) IV 2 g (06/11/22 0820)   [START ON 06/15/2022] Oritavancin Diphosphate (ORBACTIV) 1,200 mg in dextrose 5 % IVPB     PRN Meds:.acetaminophen **OR** acetaminophen No Known Allergies  OBJECTIVE: Vitals:   06/10/22 0600 06/10/22 0813 06/10/22 2046 06/11/22 0824  BP: 122/72 (!) 113/58 120/85 105/66  Pulse: 89 78 94 79  Resp: 19  17 18   Temp:  99 F (37.2 C) 98.4 F (36.9 C) 97.6 F (36.4 C)  TempSrc:   Oral Oral  SpO2: 100% 100% 100% 100%  Weight:      Height:       Body mass index is 33.97 kg/m.  Physical Exam Constitutional:      General: He is not in acute distress.    Appearance: He is normal weight. He is not toxic-appearing.  HENT:     Head: Normocephalic and atraumatic.     Right Ear: External ear normal.     Left Ear: External ear normal.     Nose: No congestion or rhinorrhea.     Mouth/Throat:     Mouth: Mucous membranes are moist.     Pharynx: Oropharynx is clear.  Eyes:     Extraocular Movements: Extraocular movements intact.     Conjunctiva/sclera:  Conjunctivae normal.     Pupils: Pupils are equal, round, and reactive to light.  Cardiovascular:     Rate and Rhythm: Normal rate and regular rhythm.     Heart sounds: No murmur heard.    No friction rub. No gallop.  Pulmonary:     Effort: Pulmonary effort is normal.     Breath sounds: Normal breath sounds.  Abdominal:     General: Abdomen is flat. Bowel sounds are normal.     Palpations: Abdomen is soft.  Musculoskeletal:        General: No swelling. Normal range of motion.     Cervical back: Normal range of motion and neck supple.  Skin:    General: Skin is warm and dry.  Neurological:     General: No focal deficit present.     Mental Status: He is oriented to person, place, and time.  Psychiatric:         Mood and Affect: Mood normal.          Lab Results Lab Results  Component Value Date   WBC 10.7 (H) 06/11/2022   HGB 11.7 (L) 06/11/2022   HCT 38.0 (L) 06/11/2022   MCV 90.3 06/11/2022   PLT 353 06/11/2022    Lab Results  Component Value Date   CREATININE 1.06 06/08/2022   BUN 9 06/08/2022   NA 137 06/08/2022   K 3.9 06/08/2022   CL 104 06/08/2022   CO2 25 06/08/2022    Lab Results  Component Value Date   ALT 15 06/05/2022   AST 26 06/05/2022   ALKPHOS 54 06/05/2022   BILITOT 0.5 06/05/2022        Danelle Earthly, MD [facility] 06/11/2022, 11:27 AM   I have personally spent 55 minutes involved in face-to-face and non-face-to-face activities for this patient on the day of the visit. Professional time spent includes the following activities: Preparing to see the patient (review of tests), Obtaining and/or reviewing separately obtained history (admission/discharge record), Performing a medically appropriate examination and/or evaluation , Ordering medications/tests/procedures, referring and communicating with other health care professionals, Documenting clinical information in the EMR, Independently interpreting results (not separately reported), Communicating results to the patient/family/caregiver, Counseling and educating the patient/family/caregiver and Care coordination (not separately reported).

## 2022-06-11 NOTE — Plan of Care (Signed)
  Problem: Education: Goal: Knowledge of General Education information will improve Description: Including pain rating scale, medication(s)/side effects and non-pharmacologic comfort measures Outcome: Progressing Pt has an understanding of his hospital admission.  He understand he will be on abx per MD's orders for his dx of cellulitis.  Problem: Clinical Measurements: Goal: Will remain free from infection Outcome: Progressing S/Sx of infection monitored and assessed q-shift. Pt has remained afebrile thus far. Educated and informed pt on effective/ proper hand hygiene to prevent further spread if infection.  Pt has an understanding of his hospital admission.  He understand he will be on abx per MD's orders for his dx of cellulitis.  Problem: Clinical Measurements: Goal: Respiratory complications will improve Outcome: Progressing Respiratory status monitored and assessed q-shift. Pt is on room air with O2 saturation at 100% and respiratory rate of 18 breaths per minute. Pt denies s/sx of SOB and DOE.        Problem: Nutrition: Goal: Adequate nutrition will be maintained Outcome: Progressing Pt was observed eating 100% of all his meals thus far.    Problem: Elimination: Goal: Will not experience complications related to urinary retention Outcome: Progressing Pt has denied urinary retention.   Problem: Pain Managment: Goal: General experience of comfort will improve Outcome: Progressing Pt has denied pain thus far.   Problem: Skin Integrity: Goal: Risk for impaired skin integrity will decrease Outcome: Progressing Skin integrity monitored and assessed q-shift. Pt has no skin impairment thus far. He is mobile and can independently ambulate in his room and hallways.      Problem: Safety: Goal: Ability to remain free from injury will improve Outcome: Progressing Pt has remained free from falls thus far. Instructed pt and family to utilize RN call light for assistance. Hourly rounds  performed. Bed in lowest position, locked with two upper lower side rails engaged. Belongings and call light within reach.

## 2022-06-11 NOTE — Plan of Care (Signed)
A/Ox4 and on room air. Up with self care. Patient removed PIV overnight and was not able to get a new line in until 0530, so he missed his midnight dose of ancef.   Problem: Education: Goal: Knowledge of General Education information will improve Description: Including pain rating scale, medication(s)/side effects and non-pharmacologic comfort measures Outcome: Progressing   Problem: Health Behavior/Discharge Planning: Goal: Ability to manage health-related needs will improve Outcome: Progressing   Problem: Clinical Measurements: Goal: Ability to maintain clinical measurements within normal limits will improve Outcome: Progressing Goal: Will remain free from infection Outcome: Progressing Goal: Diagnostic test results will improve Outcome: Progressing Goal: Respiratory complications will improve Outcome: Progressing Goal: Cardiovascular complication will be avoided Outcome: Progressing   Problem: Activity: Goal: Risk for activity intolerance will decrease Outcome: Progressing   Problem: Nutrition: Goal: Adequate nutrition will be maintained Outcome: Progressing   Problem: Coping: Goal: Level of anxiety will decrease Outcome: Progressing   Problem: Elimination: Goal: Will not experience complications related to bowel motility Outcome: Progressing Goal: Will not experience complications related to urinary retention Outcome: Progressing   Problem: Pain Managment: Goal: General experience of comfort will improve Outcome: Progressing   Problem: Safety: Goal: Ability to remain free from injury will improve Outcome: Progressing   Problem: Skin Integrity: Goal: Risk for impaired skin integrity will decrease Outcome: Progressing

## 2022-06-11 NOTE — Progress Notes (Signed)
PROGRESS NOTE  Billy Ward  DOB: Oct 31, 1980  PCP: Aviva Kluver ZOX:[id number]  DOA: 06/05/2022  LOS: 4 days  Hospital Day: 7  Brief narrative: Billy Ward is a 42 y.o. male with PMH significant for eczema, cocaine abuse who lives in a homeless shelter. 5/3, he was recently seen in the ED for right ear pain and was noted to have ongoing skin changes at that time.  Ear exam was benign and reassuring and he was discharged with a prescription of Kenalog. Due to some reported hearing loss at the time, he was recommended to follow-up with ENT. 5/10, patient presented with chills, cough, body aches, leg swelling and flaking off of skin which typically happens for him when eczema flares up.  On workup, he was felt to have a possible cellulitis associated with his eczema.   He was started on antibiotics Blood cultures were sent. Due to the diffuse distribution, initially request for transfer to a tertiary center was pursued but was not successful because of no bed availability.   Admitted to [facility] on IV antibiotics  Blood culture sent on admission grew MSSA ID consulted  Subjective: Patient was seen and examined this morning.  Pleasant young African-American male.  Lying on bed.  Not in distress.  No new symptoms.   TTE without vegetation. 2 weeks of IV antibiotics, EOT 5/27. Not a candidate for PICC line.  Assessment and plan: MSSA bacteremia Eczema Patient presented with fever with no leukocytosis.   Blood culture sent admission grew MSSA.   Patient reports that he had diffuse eczema which quickly resolved.  I suspect that patient may have had a skin breakdown from scratching his rash leading to bacteremia.  He has poor skin hygiene given social situation (lives in a shelter) Repeat blood culture sent on 5/13 did not show any growth.   TTE without vegetations.   Currently on IV cefazolin.  ID recommends 2 weeks course, EOT 5/27. Not a candidate for PICC line because of regular drug  abuse Continue topical triamcinolone and hydrocortisone for eczema.  Needs to see dermatology as an outpatient. Recent Labs  Lab 06/05/22 2200 06/06/22 0027 06/06/22 1551 06/07/22 0337 06/08/22 8119 06/09/22 0335 06/10/22 0625 06/11/22 0609  WBC  --   --  8.2 8.0 9.3 12.3* 12.6* 10.7*  LATICACIDVEN 1.3 1.3  --   --   --   --   --   --   PROCALCITON  --   --  <0.10  --   --   --   --   --     AKI (acute kidney injury)  Resolved with IV hydration Recent Labs    06/05/22 1853 06/06/22 1551 06/07/22 0337 06/08/22 0634  BUN 10 8 12 9   CREATININE 1.34* 1.17 1.13 1.06   H/o cocaine use Counseled to quit   Mobility: Encourage ambulation  Goals of care   Code Status: Full Code     DVT prophylaxis: SCDs Place and maintain sequential compression device Start: 06/10/22 0920   Antimicrobials: Currently on IV Ancef Fluid: None Consultants: ID Family Communication: None at bedside  Status: Patient Level of care:  Med-Surg   Patient from: Homeless shelter Anticipated d/c to: Probably back to homeless shelter after completion of antibiotics on 5/27. Needs to continue in-hospital care:  Remains on IV Ancef till 5/27   Diet:  Diet Order             Diet regular Room service appropriate? Yes; Fluid consistency: Thin  Diet effective now                   Scheduled Meds:  sodium chloride flush  3 mL Intravenous Q12H   triamcinolone cream   Topical TID    PRN meds: acetaminophen **OR** acetaminophen   Infusions:    ceFAZolin (ANCEF) IV 2 g (06/11/22 0820)    Antimicrobials: Anti-infectives (From admission, onward)    Start     Dose/Rate Route Frequency Ordered Stop   06/15/22 0800  Oritavancin Diphosphate (ORBACTIV) 1,200 mg in dextrose 5 % IVPB  Status:  Discontinued        1,200 mg 333.3 mL/hr over 180 Minutes Intravenous Once 06/11/22 0906 06/11/22 1149   06/07/22 1400  ceFAZolin (ANCEF) IVPB 2g/100 mL premix        2 g 200 mL/hr over 30 Minutes  Intravenous Every 8 hours 06/07/22 1107 06/22/22 2359   06/06/22 2200  cephALEXin (KEFLEX) capsule 500 mg  Status:  Discontinued        500 mg Oral 2 times daily 06/06/22 1514 06/07/22 1107   06/06/22 1545  ceFAZolin (ANCEF) IVPB 2g/100 mL premix  Status:  Discontinued        2 g 200 mL/hr over 30 Minutes Intravenous Every 8 hours 06/06/22 1449 06/06/22 1514   06/05/22 2200  cefTRIAXone (ROCEPHIN) 2 g in sodium chloride 0.9 % 100 mL IVPB  Status:  Discontinued        2 g 200 mL/hr over 30 Minutes Intravenous Every 24 hours 06/05/22 2158 06/06/22 1449       Nutritional status:  Body mass index is 33.97 kg/m.          Objective: Vitals:   06/10/22 2046 06/11/22 0824  BP: 120/85 105/66  Pulse: 94 79  Resp: 17 18  Temp: 98.4 F (36.9 C) 97.6 F (36.4 C)  SpO2: 100% 100%    Intake/Output Summary (Last 24 hours) at 06/11/2022 1311 Last data filed at 06/11/2022 1000 Gross per 24 hour  Intake 243 ml  Output --  Net 243 ml   Filed Weights   06/05/22 1828  Weight: 104.3 kg   Weight change:  Body mass index is 33.97 kg/m.   Physical Exam: General exam: Pleasant, young African-American male.  Not in distress Skin: No rashes, lesions or ulcers.  No evidence of eczema currently HEENT: Atraumatic, normocephalic, no obvious bleeding Lungs: Clear to auscultation bilaterally CVS: Regular rate and rhythm, no murmur GI/Abd soft, nontender, nondistended, bowel sound present CNS: Alert, awake, oriented x 3 Psychiatry: Mood appropriate Extremities: No pedal edema, no calf tenderness  Data Review: I have personally reviewed the laboratory data and studies available.  F/u labs ordered Unresulted Labs (From admission, onward)    None       Total time spent in review of labs and imaging, patient evaluation, formulation of plan, documentation and communication with family: 25 minutes  Signed, Lorin Glass, MD [facility] 06/11/2022

## 2022-06-11 NOTE — TOC Progression Note (Signed)
Transition of Care [facility]) - Progression Note    Patient Details  Name: Billy Ward MRN: [id number] Date of Birth: 03-07-1980  [facility] CM/SW Contact  Harriet Masson, RN Phone Number: 06/11/2022, 12:32 PM  Clinical Narrative:     MSSA bacteremia with 2 weeks of IV therapy from negative cultures.  TOC following.   Expected Discharge Plan: Home/Self Care Barriers to Discharge: Continued Medical Work up  Expected Discharge Plan and Services       Living arrangements for the past 2 months: Apartment                                       Social Determinants of Health (SDOH) Interventions SDOH Screenings   Food Insecurity: No Food Insecurity (06/06/2022)  Housing: Low Risk  (06/06/2022)  Transportation Needs: Unmet Transportation Needs (06/06/2022)  Utilities: At Risk (06/06/2022)  Tobacco Use: High Risk (06/06/2022)    Readmission Risk Interventions     No data to display

## 2022-06-12 LAB — CULTURE, BLOOD (ROUTINE X 2): Culture: NO GROWTH

## 2022-06-12 MED ORDER — SODIUM CHLORIDE 0.9% FLUSH: 10.0000 mL | INTRAVENOUS | Status: DC | PRN

## 2022-06-12 MED ORDER — SODIUM CHLORIDE 0.9% FLUSH
10.0000 mL | Freq: Two times a day (BID) | INTRAVENOUS | Status: DC
  Administered 2022-06-12: 40 mL
  Administered 2022-06-12 – 2022-06-16 (×4): 10 mL
  Administered 2022-06-17: 20 mL

## 2022-06-12 NOTE — Progress Notes (Signed)
PROGRESS NOTE  Billy Ward  DOB: December 07, 1980  PCP: Aviva Kluver ZOX:[id number]  DOA: 06/05/2022  LOS: 5 days  Hospital Day: 8  Brief narrative: Billy Ward is a 42 y.o. male with PMH significant for eczema, cocaine abuse who lives in a homeless shelter. 5/3, he was recently seen in the ED for right ear pain and was noted to have ongoing skin changes at that time.  Ear exam was benign and reassuring and he was discharged with a prescription of Kenalog. Due to some reported hearing loss at the time, he was recommended to follow-up with ENT. 5/10, patient presented with chills, cough, body aches, leg swelling and flaking off of skin which typically happens for him when eczema flares up.  On workup, he was felt to have a possible cellulitis associated with his eczema.   He was started on antibiotics Blood cultures were sent. Due to the diffuse distribution, initially request for transfer to a tertiary center was pursued but was not successful because of no bed availability.   Admitted to [facility] on IV antibiotics  Blood culture sent on admission grew MSSA ID consulted  Subjective: Patient was seen and examined this morning.  Lying down.  Not in distress.  No new symptoms. To complete 2 weeks course of IV antibiotics on 5/27.  Assessment and plan: MSSA bacteremia Eczema Patient presented with fever with no leukocytosis.   Blood culture sent admission grew MSSA.   Patient reports that he had diffuse eczema which quickly resolved.  I suspect that patient may have had a skin breakdown from scratching his rash leading to bacteremia.  He has poor skin hygiene given social situation (lives in a shelter) Repeat blood culture sent on 5/13 did not show any growth.   TTE without vegetations.   Currently on IV cefazolin.  ID recommended 2 weeks course, EOT 5/27. Not a candidate for PICC line because of regular drug abuse.  Midline placement requested. Continue topical triamcinolone and hydrocortisone  for eczema.  Needs to see dermatology as an outpatient. Recent Labs  Lab 06/05/22 2200 06/06/22 0027 06/06/22 1551 06/07/22 0337 06/08/22 8119 06/09/22 0335 06/10/22 0625 06/11/22 0609  WBC  --   --  8.2 8.0 9.3 12.3* 12.6* 10.7*  LATICACIDVEN 1.3 1.3  --   --   --   --   --   --   PROCALCITON  --   --  <0.10  --   --   --   --   --     AKI (acute kidney injury)  Resolved with IV hydration Recent Labs    06/05/22 1853 06/06/22 1551 06/07/22 0337 06/08/22 0634  BUN 10 8 12 9   CREATININE 1.34* 1.17 1.13 1.06   H/o cocaine use Counseled to quit   Mobility: Encourage ambulation  Goals of care   Code Status: Full Code     DVT prophylaxis: SCDs Place and maintain sequential compression device Start: 06/10/22 0920   Antimicrobials: Currently on IV Ancef Fluid: None Consultants: ID Family Communication: None at bedside  Status: Patient Level of care:  Med-Surg   Patient from: Homeless shelter Anticipated d/c to: Probably back to homeless shelter after completion of antibiotics on 5/27. Needs to continue in-hospital care:  Remains on IV Ancef till 5/27   Diet:  Diet Order             Diet regular Room service appropriate? Yes; Fluid consistency: Thin  Diet effective now  Scheduled Meds:  sodium chloride flush  10-40 mL Intracatheter Q12H   sodium chloride flush  3 mL Intravenous Q12H   triamcinolone cream   Topical TID    PRN meds: acetaminophen **OR** acetaminophen, sodium chloride flush   Infusions:    ceFAZolin (ANCEF) IV 2 g (06/12/22 1019)    Antimicrobials: Anti-infectives (From admission, onward)    Start     Dose/Rate Route Frequency Ordered Stop   06/15/22 0800  Oritavancin Diphosphate (ORBACTIV) 1,200 mg in dextrose 5 % IVPB  Status:  Discontinued        1,200 mg 333.3 mL/hr over 180 Minutes Intravenous Once 06/11/22 0906 06/11/22 1149   06/07/22 1400  ceFAZolin (ANCEF) IVPB 2g/100 mL premix        2 g 200  mL/hr over 30 Minutes Intravenous Every 8 hours 06/07/22 1107 06/22/22 2359   06/06/22 2200  cephALEXin (KEFLEX) capsule 500 mg  Status:  Discontinued        500 mg Oral 2 times daily 06/06/22 1514 06/07/22 1107   06/06/22 1545  ceFAZolin (ANCEF) IVPB 2g/100 mL premix  Status:  Discontinued        2 g 200 mL/hr over 30 Minutes Intravenous Every 8 hours 06/06/22 1449 06/06/22 1514   06/05/22 2200  cefTRIAXone (ROCEPHIN) 2 g in sodium chloride 0.9 % 100 mL IVPB  Status:  Discontinued        2 g 200 mL/hr over 30 Minutes Intravenous Every 24 hours 06/05/22 2158 06/06/22 1449       Nutritional status:  Body mass index is 33.97 kg/m.          Objective: Vitals:   06/12/22 0400 06/12/22 1015  BP: 96/65 (!) 110/57  Pulse: 79 92  Resp: 16   Temp: 98.2 F (36.8 C) 98.8 F (37.1 C)  SpO2: 100% 96%    Intake/Output Summary (Last 24 hours) at 06/12/2022 1351 Last data filed at 06/12/2022 1124 Gross per 24 hour  Intake 1350 ml  Output --  Net 1350 ml   Filed Weights   06/05/22 1828  Weight: 104.3 kg   Weight change:  Body mass index is 33.97 kg/m.   Physical Exam: General exam: Pleasant, young African-American male.  Not in distress Skin: No rashes, lesions or ulcers.  No evidence of eczema currently HEENT: Atraumatic, normocephalic, no obvious bleeding Lungs: Clear to auscultation bilaterally CVS: Regular rate and rhythm, no murmur GI/Abd soft, nontender, nondistended, bowel sound present CNS: Alert, awake, oriented x 3 Psychiatry: Mood appropriate Extremities: No pedal edema, no calf tenderness  Data Review: I have personally reviewed the laboratory data and studies available.  F/u labs ordered Unresulted Labs (From admission, onward)    None       Total time spent in review of labs and imaging, patient evaluation, formulation of plan, documentation and communication with family: 25 minutes  Signed, Lorin Glass, MD [facility] 06/12/2022

## 2022-06-12 NOTE — Progress Notes (Addendum)

## 2022-06-12 NOTE — Plan of Care (Signed)
Problem: Education: Goal: Knowledge of General Education information will improve Description: Including pain rating scale, medication(s)/side effects and non-pharmacologic comfort measures Outcome: Progressing Pt has an understanding of his hospital admission.  He understand he will be on abx per MD's orders for his dx of cellulitis.   Problem: Clinical Measurements: Goal: Will remain free from infection Outcome: Progressing S/Sx of infection monitored and assessed q-shift. Pt has remained afebrile thus far. Educated and informed pt on effective/ proper hand hygiene to prevent further spread if infection.  Pt has an understanding of his hospital admission.  He understand he will be on abx per MD's orders for his dx of cellulitis.   Problem: Clinical Measurements: Goal: Respiratory complications will improve Outcome: Progressing Respiratory status monitored and assessed q-shift. Pt is on room air with O2 saturation at 96% and respiratory rate of 16 breaths per minute. Pt denies s/sx of SOB and DOE.         Problem: Nutrition: Goal: Adequate nutrition will be maintained Outcome: Progressing Pt was observed eating 100% of all his meals thus far.      Problem: Elimination: Goal: Will not experience complications related to urinary retention Outcome: Progressing Pt has denied urinary retention.    Problem: Pain Managment: Goal: General experience of comfort will improve Outcome: Progressing Pt has denied pain thus far.    Problem: Skin Integrity: Goal: Risk for impaired skin integrity will decrease Outcome: Progressing Skin integrity monitored and assessed q-shift. Pt has no skin impairment thus far. He is mobile and can independently ambulate in his room and hallways.       Problem: Safety: Goal: Ability to remain free from injury will improve Outcome: Progressing Pt has remained free from falls thus far. Instructed pt and family to utilize RN call light for assistance. Hourly  rounds performed. Bed in lowest position, locked with two upper lower side rails engaged. Belongings and call light within reach.

## 2022-06-13 LAB — CULTURE, BLOOD (ROUTINE X 2)
Culture: NO GROWTH
Special Requests: ADEQUATE

## 2022-06-13 NOTE — Progress Notes (Signed)
PROGRESS NOTE  Billy Ward  DOB: 1980/01/29  PCP: Aviva Kluver OZH:[id number]  DOA: 06/05/2022  LOS: 6 days  Hospital Day: 9  Brief narrative: Billy Ward is a 42 y.o. male with PMH significant for eczema, cocaine abuse who lives in a homeless shelter. 5/3, he was recently seen in the ED for right ear pain and was noted to have ongoing skin changes at that time.  Ear exam was benign and reassuring and he was discharged with a prescription of Kenalog. Due to some reported hearing loss at the time, he was recommended to follow-up with ENT. 5/10, patient presented with chills, cough, body aches, leg swelling and flaking off of skin which typically happens for him when eczema flares up.  On workup, he was felt to have a possible cellulitis associated with his eczema.   He was started on antibiotics Blood cultures were sent. Due to the diffuse distribution, initially request for transfer to a tertiary center was pursued but was not successful because of no bed availability.   Admitted to [facility] on IV antibiotics  Blood culture sent on admission grew MSSA ID consulted  Subjective: Patient was seen and examined this morning.   Lying down in bed.  Not in distress.  No new symptoms. Waiting to complete IV antibiotics on 5/27.  Assessment and plan: MSSA bacteremia Eczema Patient presented with fever with no leukocytosis.   Blood culture sent admission grew MSSA.   Patient reports that he had diffuse eczema which quickly resolved.  I suspect that patient may have had a skin breakdown from scratching his rash leading to bacteremia.  He has poor skin hygiene given social situation (lives in a shelter) Repeat blood culture sent on 5/13 did not show any growth.   TTE without vegetations.   Currently on IV cefazolin.  ID recommended 2 weeks course, EOT 5/27. Not a candidate for PICC line because of regular drug abuse.  Midline placed on 5/17. Continue topical triamcinolone and hydrocortisone for  eczema.  Needs to see dermatology as an outpatient. Recent Labs  Lab 06/06/22 1551 06/07/22 0337 06/08/22 0634 06/09/22 0335 06/10/22 0625 06/11/22 0609  WBC 8.2 8.0 9.3 12.3* 12.6* 10.7*  PROCALCITON <0.10  --   --   --   --   --      AKI (acute kidney injury)  Resolved with IV hydration Recent Labs    06/05/22 1853 06/06/22 1551 06/07/22 0337 06/08/22 0634  BUN 10 8 12 9   CREATININE 1.34* 1.17 1.13 1.06    H/o cocaine use Counseled to quit   Mobility: Encourage ambulation  Goals of care   Code Status: Full Code     DVT prophylaxis: SCDs Place and maintain sequential compression device Start: 06/10/22 0920   Antimicrobials: Currently on IV Ancef Fluid: None Consultants: ID Family Communication: None at bedside  Status: Patient Level of care:  Med-Surg   Patient from: Homeless shelter Anticipated d/c to: Probably back to homeless shelter after completion of antibiotics on 5/27. Needs to continue in-hospital care:  Remains on IV Ancef till 5/27   Diet:  Diet Order             Diet regular Room service appropriate? Yes; Fluid consistency: Thin  Diet effective now                   Scheduled Meds:  sodium chloride flush  10-40 mL Intracatheter Q12H   sodium chloride flush  3 mL Intravenous Q12H   triamcinolone  cream   Topical TID    PRN meds: acetaminophen **OR** acetaminophen, sodium chloride flush   Infusions:    ceFAZolin (ANCEF) IV 2 g (06/13/22 0747)    Antimicrobials: Anti-infectives (From admission, onward)    Start     Dose/Rate Route Frequency Ordered Stop   06/15/22 0800  Oritavancin Diphosphate (ORBACTIV) 1,200 mg in dextrose 5 % IVPB  Status:  Discontinued        1,200 mg 333.3 mL/hr over 180 Minutes Intravenous Once 06/11/22 0906 06/11/22 1149   06/07/22 1400  ceFAZolin (ANCEF) IVPB 2g/100 mL premix        2 g 200 mL/hr over 30 Minutes Intravenous Every 8 hours 06/07/22 1107 06/22/22 2359   06/06/22 2200  cephALEXin  (KEFLEX) capsule 500 mg  Status:  Discontinued        500 mg Oral 2 times daily 06/06/22 1514 06/07/22 1107   06/06/22 1545  ceFAZolin (ANCEF) IVPB 2g/100 mL premix  Status:  Discontinued        2 g 200 mL/hr over 30 Minutes Intravenous Every 8 hours 06/06/22 1449 06/06/22 1514   06/05/22 2200  cefTRIAXone (ROCEPHIN) 2 g in sodium chloride 0.9 % 100 mL IVPB  Status:  Discontinued        2 g 200 mL/hr over 30 Minutes Intravenous Every 24 hours 06/05/22 2158 06/06/22 1449       Nutritional status:  Body mass index is 33.97 kg/m.          Objective: Vitals:   06/13/22 0420 06/13/22 0753  BP: 116/71 116/75  Pulse: 79 76  Resp: 14 16  Temp:  98.7 F (37.1 C)  SpO2: 99% 100%    Intake/Output Summary (Last 24 hours) at 06/13/2022 1146 Last data filed at 06/13/2022 0850 Gross per 24 hour  Intake 1023 ml  Output --  Net 1023 ml    Filed Weights   06/05/22 1828  Weight: 104.3 kg   Weight change:  Body mass index is 33.97 kg/m.   Physical Exam: General exam: Pleasant, young African-American male.  Not in distress Skin: No rashes, lesions or ulcers.  No evidence of eczema currently HEENT: Atraumatic, normocephalic, no obvious bleeding Lungs: Clear to auscultation bilaterally CVS: Regular rate and rhythm, no murmur GI/Abd soft, nontender, nondistended, bowel sound present CNS: Alert, awake, oriented x 3 Psychiatry: Mood appropriate Extremities: No pedal edema, no calf tenderness  Data Review: I have personally reviewed the laboratory data and studies available.  F/u labs ordered Unresulted Labs (From admission, onward)    None       Total time spent in review of labs and imaging, patient evaluation, formulation of plan, documentation and communication with family: 25 minutes  Signed, Lorin Glass, MD [facility] 06/13/2022

## 2022-06-13 NOTE — Plan of Care (Signed)

## 2022-06-14 NOTE — Progress Notes (Signed)
PROGRESS NOTE  Billy Ward  DOB: [birth year]  PCP: Aviva Kluver WUJ:[id number]  DOA: 06/05/2022  LOS: 7 days  Hospital Day: 10  Brief narrative: Billy Ward is a 42 y.o. male with PMH significant for eczema, cocaine abuse who lives in a homeless shelter. 5/3, he was recently seen in the ED for right ear pain and was noted to have ongoing skin changes at that time.  Ear exam was benign and reassuring and he was discharged with a prescription of Kenalog. Due to some reported hearing loss at the time, he was recommended to follow-up with ENT. 5/10, patient presented with chills, cough, body aches, leg swelling and flaking off of skin which typically happens for him when eczema flares up.  On workup, he was felt to have a possible cellulitis associated with his eczema.   He was started on antibiotics Blood cultures were sent. Due to the diffuse distribution, initially request for transfer to a tertiary center was pursued but was not successful because of no bed availability.   Admitted to [facility] on IV antibiotics  Blood culture sent on admission grew MSSA ID consulted  Subjective: Patient was seen and examined this morning.   Lying on bed.  Not in distress.  Patient had removed IV line 3 times by yesterday. Was placed yesterday and a bedside sitter was kept in place.  Assessment and plan: MSSA bacteremia Eczema Patient presented with fever with no leukocytosis.   Blood culture sent admission grew MSSA.   Patient reports that he had diffuse eczema which quickly resolved.  I suspect that patient may have had a skin breakdown from scratching his rash leading to bacteremia.  He has poor skin hygiene given social situation (lives in a shelter) Repeat blood culture sent on 5/13 did not show any growth.   TTE without vegetations.   Currently on IV cefazolin.  ID recommended 2 weeks course, EOT 5/27. Not a candidate for PICC line because of regular drug abuse.   Continue topical triamcinolone  and hydrocortisone for eczema.  Needs to see dermatology as an outpatient. Recent Labs  Lab 06/08/22 0634 06/09/22 0335 06/10/22 0625 06/11/22 0609  WBC 9.3 12.3* 12.6* 10.7*    AKI (acute kidney injury)  Resolved with IV hydration Recent Labs    06/05/22 1853 06/06/22 1551 06/07/22 0337 06/08/22 0634  BUN 10 8 12 9   CREATININE 1.34* 1.17 1.13 1.06   H/o cocaine use Counseled to quit   Mobility: Encourage ambulation  Goals of care   Code Status: Full Code     DVT prophylaxis: SCDs Place and maintain sequential compression device Start: 06/10/22 0920   Antimicrobials: Currently on IV Ancef Fluid: None Consultants: ID Family Communication: None at bedside  Status: Patient Level of care:  Med-Surg   Patient from: Homeless shelter Anticipated d/c to: Probably back to homeless shelter after completion of antibiotics on 5/27. Needs to continue in-hospital care:  Remains on IV Ancef till 5/27   Diet:  Diet Order             Diet regular Room service appropriate? Yes; Fluid consistency: Thin  Diet effective now                   Scheduled Meds:  sodium chloride flush  10-40 mL Intracatheter Q12H   sodium chloride flush  3 mL Intravenous Q12H   triamcinolone cream   Topical TID    PRN meds: acetaminophen **OR** acetaminophen, sodium chloride flush   Infusions:  ceFAZolin (ANCEF) IV 2 g (06/14/22 1610)    Antimicrobials: Anti-infectives (From admission, onward)    Start     Dose/Rate Route Frequency Ordered Stop   06/15/22 0800  Oritavancin Diphosphate (ORBACTIV) 1,200 mg in dextrose 5 % IVPB  Status:  Discontinued        1,200 mg 333.3 mL/hr over 180 Minutes Intravenous Once 06/11/22 0906 06/11/22 1149   06/07/22 1400  ceFAZolin (ANCEF) IVPB 2g/100 mL premix        2 g 200 mL/hr over 30 Minutes Intravenous Every 8 hours 06/07/22 1107 06/22/22 2359   06/06/22 2200  cephALEXin (KEFLEX) capsule 500 mg  Status:  Discontinued        500 mg Oral  2 times daily 06/06/22 1514 06/07/22 1107   06/06/22 1545  ceFAZolin (ANCEF) IVPB 2g/100 mL premix  Status:  Discontinued        2 g 200 mL/hr over 30 Minutes Intravenous Every 8 hours 06/06/22 1449 06/06/22 1514   06/05/22 2200  cefTRIAXone (ROCEPHIN) 2 g in sodium chloride 0.9 % 100 mL IVPB  Status:  Discontinued        2 g 200 mL/hr over 30 Minutes Intravenous Every 24 hours 06/05/22 2158 06/06/22 1449       Nutritional status:  Body mass index is 33.97 kg/m.          Objective: Vitals:   06/14/22 0348 06/14/22 0800  BP: 112/77 99/70  Pulse: 66 72  Resp: 14 16  Temp: 98.2 F (36.8 C)   SpO2: 100% 100%    Intake/Output Summary (Last 24 hours) at 06/14/2022 1138 Last data filed at 06/13/2022 1950 Gross per 24 hour  Intake 540 ml  Output 3 ml  Net 537 ml   Filed Weights   06/05/22 1828  Weight: 104.3 kg   Weight change:  Body mass index is 33.97 kg/m.   Physical Exam: General exam: Pleasant, young African-American male.  Not in distress Skin: No rashes, lesions or ulcers.  No evidence of eczema currently HEENT: Atraumatic, normocephalic, no obvious bleeding Lungs: Clear to auscultation bilaterally CVS: Regular rate and rhythm, no murmur GI/Abd soft, nontender, nondistended, bowel sound present CNS: Alert, awake, oriented x 3 Psychiatry: Mood appropriate Extremities: No pedal edema, no calf tenderness  Data Review: I have personally reviewed the laboratory data and studies available.  F/u labs ordered Unresulted Labs (From admission, onward)    None       Total time spent in review of labs and imaging, patient evaluation, formulation of plan, documentation and communication with family: 25 minutes  Signed, Lorin Glass, MD [facility] 06/14/2022

## 2022-06-15 ENCOUNTER — Other Ambulatory Visit: Payer: Self-pay

## 2022-06-15 NOTE — Plan of Care (Signed)
Problem: Education: Goal: Knowledge of General Education information will improve Description: Including pain rating scale, medication(s)/side effects and non-pharmacologic comfort measures Outcome: Progressing Pt has an understanding of his hospital admission.  He understand he will be on abx per MD's orders for his dx of cellulitis.   Problem: Clinical Measurements: Goal: Will remain free from infection Outcome: Progressing S/Sx of infection monitored and assessed q-shift. Pt has remained afebrile thus far. Educated and informed pt on effective/ proper hand hygiene to prevent further spread if infection.  Pt has an understanding of his hospital admission.  He understand he will be on abx per MD's orders for his dx of cellulitis.   Problem: Clinical Measurements: Goal: Respiratory complications will improve Outcome: Progressing Respiratory status monitored and assessed q-shift. Pt is on room air with O2 saturation at 100% and respiratory rate of 16-18 breaths per minute. Pt denies s/sx of SOB and DOE.         Problem: Nutrition: Goal: Adequate nutrition will be maintained Outcome: Progressing Pt was observed eating 100% of all his meals thus far.      Problem: Elimination: Goal: Will not experience complications related to urinary retention Outcome: Progressing Pt has denied urinary retention.    Problem: Pain Managment: Goal: General experience of comfort will improve Outcome: Progressing Pt has denied pain thus far.    Problem: Skin Integrity: Goal: Risk for impaired skin integrity will decrease Outcome: Progressing Skin integrity monitored and assessed q-shift. Pt has no skin impairment thus far. He is mobile and can independently ambulate in his room and hallways.       Problem: Safety: Goal: Ability to remain free from injury will improve Outcome: Progressing Pt has remained free from falls thus far. Instructed pt and family to utilize RN call light for assistance.  Hourly rounds performed. Bed in lowest position, locked with two upper lower side rails engaged. Belongings and call light within reach.

## 2022-06-15 NOTE — Progress Notes (Signed)
Lila, RN made aware that PICC would not be placed until 5/21.  Per RN patient has IV access at this time.

## 2022-06-15 NOTE — Progress Notes (Signed)
PROGRESS NOTE  Billy Ward  DOB: 26-Sep-1980  PCP: Aviva Kluver GNF:[id number]  DOA: 06/05/2022  LOS: 8 days  Hospital Day: 11  Brief narrative: Billy Ward is a 42 y.o. male with PMH significant for eczema, cocaine abuse who lives in a homeless shelter. 5/3, he was recently seen in the ED for right ear pain and was noted to have ongoing skin changes at that time.  Ear exam was benign and reassuring and he was discharged with a prescription of Kenalog. Due to some reported hearing loss at the time, he was recommended to follow-up with ENT. 5/10, patient presented with chills, cough, body aches, leg swelling and flaking off of skin which typically happens for him when eczema flares up.  On workup, he was felt to have a possible cellulitis associated with his eczema.   He was started on antibiotics Blood cultures were sent. Due to the diffuse distribution, initially request for transfer to a tertiary center was pursued but was not successful because of no bed availability.   Admitted to [facility] on IV antibiotics  Blood culture sent on admission grew MSSA ID consulted  Subjective: Patient was seen and examined this morning.   Lying on bed.  Not in distress no new symptoms.  Assessment and plan: MSSA bacteremia Eczema Patient presented with fever with no leukocytosis.   Blood culture sent admission grew MSSA.   Patient reports that he had diffuse eczema which quickly resolved.  I suspect that patient may have had a skin breakdown from scratching his rash leading to bacteremia.  He has poor skin hygiene given social situation (lives in a shelter) Repeat blood culture sent on 5/13 did not show any growth.   TTE without vegetations.   Currently on IV cefazolin.  ID recommended 2 weeks course, EOT 5/27. Not a candidate for PICC line because of regular drug abuse.  Per my discussion with ID, patient should continue IV antibiotics.  If he tries to leave AMA, we can tried on oral Zyvox as a  courtesy only but that cannot be used at this time as the first treatment option. Continue topical triamcinolone and hydrocortisone for eczema.  Needs to see dermatology as an outpatient. Recent Labs  Lab 06/09/22 0335 06/10/22 0625 06/11/22 0609  WBC 12.3* 12.6* 10.7*     AKI (acute kidney injury)  Resolved with IV hydration Recent Labs    06/05/22 1853 06/06/22 1551 06/07/22 0337 06/08/22 0634  BUN 10 8 12 9   CREATININE 1.34* 1.17 1.13 1.06    H/o cocaine use Counseled to quit   Mobility: Encourage ambulation  Goals of care   Code Status: Full Code     DVT prophylaxis: SCDs Place and maintain sequential compression device Start: 06/10/22 0920   Antimicrobials: Currently on IV Ancef Fluid: None Consultants: ID Family Communication: None at bedside  Status: Patient Level of care:  Med-Surg   Patient from: Homeless shelter Anticipated d/c to: Probably back to homeless shelter after completion of antibiotics on 5/27. Needs to continue in-hospital care:  Remains on IV Ancef till 5/27   Diet:  Diet Order             Diet regular Room service appropriate? Yes; Fluid consistency: Thin  Diet effective now                   Scheduled Meds:  sodium chloride flush  10-40 mL Intracatheter Q12H   sodium chloride flush  3 mL Intravenous Q12H   triamcinolone  cream   Topical TID    PRN meds: acetaminophen **OR** acetaminophen, sodium chloride flush   Infusions:    ceFAZolin (ANCEF) IV 2 g (06/15/22 0745)    Antimicrobials: Anti-infectives (From admission, onward)    Start     Dose/Rate Route Frequency Ordered Stop   06/15/22 0800  Oritavancin Diphosphate (ORBACTIV) 1,200 mg in dextrose 5 % IVPB  Status:  Discontinued        1,200 mg 333.3 mL/hr over 180 Minutes Intravenous Once 06/11/22 0906 06/11/22 1149   06/07/22 1400  ceFAZolin (ANCEF) IVPB 2g/100 mL premix        2 g 200 mL/hr over 30 Minutes Intravenous Every 8 hours 06/07/22 1107 06/22/22  2359   06/06/22 2200  cephALEXin (KEFLEX) capsule 500 mg  Status:  Discontinued        500 mg Oral 2 times daily 06/06/22 1514 06/07/22 1107   06/06/22 1545  ceFAZolin (ANCEF) IVPB 2g/100 mL premix  Status:  Discontinued        2 g 200 mL/hr over 30 Minutes Intravenous Every 8 hours 06/06/22 1449 06/06/22 1514   06/05/22 2200  cefTRIAXone (ROCEPHIN) 2 g in sodium chloride 0.9 % 100 mL IVPB  Status:  Discontinued        2 g 200 mL/hr over 30 Minutes Intravenous Every 24 hours 06/05/22 2158 06/06/22 1449       Nutritional status:  Body mass index is 33.97 kg/m.          Objective: Vitals:   06/14/22 2030 06/15/22 0743  BP: (!) 108/59 120/70  Pulse: 80 66  Resp: 18 16  Temp: 98.2 F (36.8 C) 98.2 F (36.8 C)  SpO2: 98% 100%    Intake/Output Summary (Last 24 hours) at 06/15/2022 1132 Last data filed at 06/15/2022 0755 Gross per 24 hour  Intake 240 ml  Output --  Net 240 ml    Filed Weights   06/05/22 1828  Weight: 104.3 kg   Weight change:  Body mass index is 33.97 kg/m.   Physical Exam: General exam: Pleasant, young African-American male.  Not in distress Skin: No rashes, lesions or ulcers.  No evidence of eczema currently HEENT: Atraumatic, normocephalic, no obvious bleeding Lungs: Clear to auscultation bilaterally CVS: Regular rate and rhythm, no murmur GI/Abd soft, nontender, nondistended, bowel sound present CNS: Alert, awake, oriented x 3 Psychiatry: Mood appropriate Extremities: No pedal edema, no calf tenderness  Data Review: I have personally reviewed the laboratory data and studies available.  F/u labs ordered Unresulted Labs (From admission, onward)    None       Total time spent in review of labs and imaging, patient evaluation, formulation of plan, documentation and communication with family: 25 minutes  Signed, Lorin Glass, MD [facility] 06/15/2022

## 2022-06-16 MED ORDER — SODIUM CHLORIDE 0.9% FLUSH
10.0000 mL | Freq: Two times a day (BID) | INTRAVENOUS | Status: DC
  Administered 2022-06-16 (×2): 10 mL
  Administered 2022-06-17: 40 mL

## 2022-06-16 MED ORDER — SODIUM CHLORIDE 0.9% FLUSH: 10.0000 mL | INTRAVENOUS | Status: DC | PRN

## 2022-06-16 MED ORDER — CHLORHEXIDINE GLUCONATE CLOTH 2 % EX PADS
6.0000 | MEDICATED_PAD | Freq: Every day | CUTANEOUS | Status: DC
  Administered 2022-06-16 – 2022-06-17 (×2): 6 via TOPICAL

## 2022-06-16 NOTE — Progress Notes (Signed)
PROGRESS NOTE  Billy Ward  DOB: July 11, 1980  PCP: Aviva Kluver ZOX:[id number]  DOA: 06/05/2022  LOS: 9 days  Hospital Day: 12  Brief narrative: Billy Ward is a 42 y.o. male with PMH significant for eczema, cocaine abuse who lives in a homeless shelter. 5/3, he was recently seen in the ED for right ear pain and was noted to have ongoing skin changes at that time.  Ear exam was benign and reassuring and he was discharged with a prescription of Kenalog. Due to some reported hearing loss at the time, he was recommended to follow-up with ENT. 5/10, patient presented with chills, cough, body aches, leg swelling and flaking off of skin which typically happens for him when eczema flares up.  On workup, he was felt to have a possible cellulitis associated with his eczema.   He was started on antibiotics Blood cultures were sent. Due to the diffuse distribution, initially request for transfer to a tertiary center was pursued but was not successful because of no bed availability.   Admitted to [facility] on IV antibiotics  Blood culture sent on admission grew MSSA ID consulted  Subjective: Patient was seen and examined this afternoon  No new symptoms.  Because of recurrent infiltration of peripheral IV access, patient underwent PICC line placement this morning.  Assessment and plan: MSSA bacteremia Eczema Patient presented with fever with no leukocytosis.   Blood culture sent admission grew MSSA.   Patient reports that he had diffuse eczema which quickly resolved.  I suspect that patient may have had a skin breakdown from scratching his rash leading to bacteremia.  He has poor skin hygiene given social situation (lives in a shelter) Repeat blood culture sent on 5/13 did not show any growth.   TTE without vegetations.   Currently on IV cefazolin.  ID recommended 2 weeks course, EOT 5/27. Not a candidate for PICC line because of regular drug abuse.  Per my discussion with ID, patient should continue  IV antibiotics.  If he tries to leave AMA, we can tried on oral Zyvox as a courtesy only but that cannot be used at this time as the first treatment option. Continue topical triamcinolone and hydrocortisone for eczema.  Needs to see dermatology as an outpatient. Recent Labs  Lab 06/10/22 0625 06/11/22 0609  WBC 12.6* 10.7*     AKI (acute kidney injury)  Resolved with IV hydration Recent Labs    06/05/22 1853 06/06/22 1551 06/07/22 0337 06/08/22 0634  BUN 10 8 12 9   CREATININE 1.34* 1.17 1.13 1.06    H/o cocaine use Counseled to quit   Mobility: Encourage ambulation  Goals of care   Code Status: Full Code     DVT prophylaxis: SCDs Place and maintain sequential compression device Start: 06/10/22 0920   Antimicrobials: Currently on IV Ancef Fluid: None Consultants: ID Family Communication: None at bedside  Status: Patient Level of care:  Med-Surg   Patient from: Homeless shelter Anticipated d/c to: Probably back to homeless shelter after completion of antibiotics on 5/27. Needs to continue in-hospital care:  Remains on IV Ancef till 5/27   Diet:  Diet Order             Diet regular Room service appropriate? Yes; Fluid consistency: Thin  Diet effective now                   Scheduled Meds:  Chlorhexidine Gluconate Cloth  6 each Topical Daily   sodium chloride flush  10-40 mL  Intracatheter Q12H   sodium chloride flush  10-40 mL Intracatheter Q12H   sodium chloride flush  3 mL Intravenous Q12H   triamcinolone cream   Topical TID    PRN meds: acetaminophen **OR** acetaminophen, sodium chloride flush, sodium chloride flush   Infusions:    ceFAZolin (ANCEF) IV 2 g (06/16/22 0718)    Antimicrobials: Anti-infectives (From admission, onward)    Start     Dose/Rate Route Frequency Ordered Stop   06/15/22 0800  Oritavancin Diphosphate (ORBACTIV) 1,200 mg in dextrose 5 % IVPB  Status:  Discontinued        1,200 mg 333.3 mL/hr over 180 Minutes  Intravenous Once 06/11/22 0906 06/11/22 1149   06/07/22 1400  ceFAZolin (ANCEF) IVPB 2g/100 mL premix        2 g 200 mL/hr over 30 Minutes Intravenous Every 8 hours 06/07/22 1107 06/22/22 2359   06/06/22 2200  cephALEXin (KEFLEX) capsule 500 mg  Status:  Discontinued        500 mg Oral 2 times daily 06/06/22 1514 06/07/22 1107   06/06/22 1545  ceFAZolin (ANCEF) IVPB 2g/100 mL premix  Status:  Discontinued        2 g 200 mL/hr over 30 Minutes Intravenous Every 8 hours 06/06/22 1449 06/06/22 1514   06/05/22 2200  cefTRIAXone (ROCEPHIN) 2 g in sodium chloride 0.9 % 100 mL IVPB  Status:  Discontinued        2 g 200 mL/hr over 30 Minutes Intravenous Every 24 hours 06/05/22 2158 06/06/22 1449       Nutritional status:  Body mass index is 33.97 kg/m.          Objective: Vitals:   06/15/22 2130 06/16/22 0714  BP: 117/76 104/65  Pulse: 82 81  Resp: 18 18  Temp: 98.7 F (37.1 C) 98.7 F (37.1 C)  SpO2: 100% 100%    Intake/Output Summary (Last 24 hours) at 06/16/2022 1306 Last data filed at 06/16/2022 0720 Gross per 24 hour  Intake 240 ml  Output --  Net 240 ml    Filed Weights   06/05/22 1828  Weight: 104.3 kg   Weight change:  Body mass index is 33.97 kg/m.   Physical Exam: General exam: Pleasant, young African-American male.  Not in distress Skin: No rashes, lesions or ulcers.  No evidence of eczema currently HEENT: Atraumatic, normocephalic, no obvious bleeding Lungs: Clear to auscultation bilaterally CVS: Regular rate and rhythm, no murmur GI/Abd soft, nontender, nondistended, bowel sound present CNS: Alert, awake, oriented x 3 Psychiatry: Mood appropriate Extremities: No pedal edema, no calf tenderness  Data Review: I have personally reviewed the laboratory data and studies available.  F/u labs ordered Unresulted Labs (From admission, onward)    None       Total time spent in review of labs and imaging, patient evaluation, formulation of plan,  documentation and communication with family: 25 minutes  Signed, Lorin Glass, MD [facility] 06/16/2022

## 2022-06-16 NOTE — Plan of Care (Signed)
Problem: Education: Goal: Knowledge of General Education information will improve Description: Including pain rating scale, medication(s)/side effects and non-pharmacologic comfort measures Outcome: Progressing Pt has an understanding of his hospital admission.  He understand he will be on abx per MD's orders for his dx of cellulitis.   Problem: Clinical Measurements: Goal: Will remain free from infection Outcome: Progressing S/Sx of infection monitored and assessed q-shift. Pt has remained afebrile thus far. Educated and informed pt on effective/ proper hand hygiene to prevent further spread if infection.  Pt has an understanding of his hospital admission.  He understand he will be on abx per MD's orders for his dx of cellulitis.  D/t his prolonged use of IV abx MD ordered a PICC line for administration.  Educated pt on CLABSI and prevention.  Informed pt that CHG baths are required/ implemented per [facility] policy, procedure and guideline to prevent CLABSI.  VAT team to manage PICC per MD's orders.       Problem: Clinical Measurements: Goal: Respiratory complications will improve Outcome: Progressing Respiratory status monitored and assessed q-shift. Pt is on room air with O2 saturation at 97-100% and respiratory rate of 18 breaths per minute. Pt denies s/sx of SOB and DOE.         Problem: Nutrition: Goal: Adequate nutrition will be maintained Outcome: Progressing Pt was observed eating 100% of all his meals thus far.      Problem: Elimination: Goal: Will not experience complications related to urinary retention Outcome: Progressing Pt has denied urinary retention.    Problem: Pain Managment: Goal: General experience of comfort will improve Outcome: Progressing Pt has denied pain thus far.    Problem: Skin Integrity: Goal: Risk for impaired skin integrity will decrease Outcome: Progressing Skin integrity monitored and assessed q-shift. Pt has no skin impairment thus far. He is mobile  and can independently ambulate in his room and hallways.       Problem: Safety: Goal: Ability to remain free from injury will improve Outcome: Progressing Pt has remained free from falls thus far. Instructed pt and family to utilize RN call light for assistance. Hourly rounds performed. Bed in lowest position, locked with two upper lower side rails engaged. Belongings and call light within reach.

## 2022-06-16 NOTE — Progress Notes (Signed)
Peripherally Inserted Central Catheter Placement  The IV Nurse has discussed with the patient and/or persons authorized to consent for the patient, the purpose of this procedure and the potential benefits and risks involved with this procedure.  The benefits include less needle sticks, lab draws from the catheter, and the patient may be discharged home with the catheter. Risks include, but not limited to, infection, bleeding, blood clot (thrombus formation), and puncture of an artery; nerve damage and irregular heartbeat and possibility to perform a PICC exchange if needed/ordered by physician.  Alternatives to this procedure were also discussed.  Bard Power PICC patient education guide, fact sheet on infection prevention and patient information card has been provided to patient /or left at bedside.    PICC Placement Documentation  PICC Single Lumen 06/16/22 Right Basilic 38 cm 0 cm (Active)  Indication for Insertion or Continuance of Line Prolonged intravenous therapies 06/16/22 1109  Exposed Catheter (cm) 0 cm 06/16/22 1109  Site Assessment Clean, Dry, Intact 06/16/22 1109  Line Status Flushed;Blood return noted;Saline locked 06/16/22 1109  Dressing Type Transparent 06/16/22 1109  Dressing Status Antimicrobial disc in place 06/16/22 1109  Safety Lock Not Applicable 06/16/22 1109  Line Care Connections checked and tightened 06/16/22 1109  Line Adjustment (NICU/IV Team Only) No 06/16/22 1109  Dressing Intervention New dressing 06/16/22 1109  Dressing Change Due 06/23/22 06/16/22 1109       Conrad Bajandas Ramos 06/16/2022, 11:10 AM

## 2022-06-17 ENCOUNTER — Other Ambulatory Visit (HOSPITAL_COMMUNITY): Payer: Self-pay

## 2022-06-17 MED ORDER — TRIAMCINOLONE ACETONIDE 0.1 % EX CREA
1.0000 | TOPICAL_CREAM | Freq: Three times a day (TID) | CUTANEOUS | 0 refills | Status: AC
  Filled 2022-06-17: qty 15, 5d supply, fill #0

## 2022-06-17 MED ORDER — ORITAVANCIN DIPHOSPHATE 400 MG IV SOLR
1200.0000 mg | Freq: Once | INTRAVENOUS | Status: AC
  Administered 2022-06-17: 1200 mg via INTRAVENOUS
  Filled 2022-06-17: qty 120

## 2022-06-17 MED ORDER — LINEZOLID 600 MG PO TABS
600.0000 mg | ORAL_TABLET | Freq: Two times a day (BID) | ORAL | 0 refills | Status: AC
  Filled 2022-06-17: qty 28, 14d supply, fill #0

## 2022-06-17 NOTE — Discharge Summary (Signed)
Physician Discharge Summary  Billy Ward ZOX:[id number] DOB: 1980/12/29 DOA: 06/05/2022  PCP: Pcp, No  Admit date: 06/05/2022 Discharge date: 06/17/2022  Admitted From: Homeless shelter Discharge disposition: Back to homeless shelter  Recommendations at discharge:  Complete the next 2 weeks course of antibiotics Stop drug abuse.    Brief narrative: Billy Ward is a 42 y.o. male with PMH significant for eczema, cocaine abuse who lives in a homeless shelter. 5/3, he was recently seen in the ED for right ear pain and was noted to have ongoing skin changes at that time.  Ear exam was benign and reassuring and he was discharged with a prescription of Kenalog. Due to some reported hearing loss at the time, he was recommended to follow-up with ENT. 5/10, patient presented with chills, cough, body aches, leg swelling and flaking off of skin which typically happens for him when eczema flares up.  On workup, he was felt to have a possible cellulitis associated with his eczema.   He was started on antibiotics Blood cultures were sent. Due to the diffuse distribution, initially request for transfer to a tertiary center was pursued but was not successful because of no bed availability.   Admitted to [facility] on IV antibiotics  Blood culture sent on admission grew MSSA ID consulted.  Per ID recommendation, patient was started on IV cefazolin.  Anticipated duration of treatment was for 2 weeks.  Patient unfortunately could not get a PICC line for discharge because of history of drug abuse.  He was kept in the hospital on IV antibiotics. Reviewed the case with ID Dr. Earlene Plater today 5/22.  1 dose of oritavancin was given.  Patient will be discharged on PO linezolid tail coverage for next 14 days.  Subjective: Patient was seen and examined this morning. Lying on bed.  Not in distress.  Assessment and plan: MSSA bacteremia Eczema Patient presented with fever with no leukocytosis.   Blood culture  sent admission grew MSSA.   Patient reports that he had diffuse eczema which quickly resolved.  I suspect that patient may have had a skin breakdown from scratching his rash leading to bacteremia.  He has poor skin hygiene given social situation (lives in a shelter) Repeat blood culture sent on 5/13 did not show any growth.   TTE without vegetations.   Currently on IV cefazolin.   Per discussion with ID, 1 dose of oritavancin given today.  Discharged on p.o. linezolid tail coverage for next 14 days. PICC line was placed yesterday 5/21.  To remove at discharge today. Continue topical triamcinolone and hydrocortisone for eczema.  Needs to see dermatology as an outpatient. Recent Labs  Lab 06/11/22 0609  WBC 10.7*    AKI (acute kidney injury)  Resolved with IV hydration Recent Labs    06/05/22 1853 06/06/22 1551 06/07/22 0337 06/08/22 0634  BUN 10 8 12 9   CREATININE 1.34* 1.17 1.13 1.06   H/o cocaine use Counseled to quit   Goals of care   Code Status: Full Code   Wounds:  -    Discharge Exam:   Vitals:   06/16/22 1521 06/16/22 1921 06/17/22 0607 06/17/22 0901  BP: 117/72 111/65 91/60 104/86  Pulse: 94 89 72 77  Resp: 18 14 16    Temp: 98.6 F (37 C) 98 F (36.7 C) 98.1 F (36.7 C) 98.7 F (37.1 C)  TempSrc: Oral Oral Oral Oral  SpO2: 97% 98% 99% 99%  Weight:      Height:  Body mass index is 33.97 kg/m.   General exam: Pleasant, young African-American male.  Not in distress Skin: No rashes, lesions or ulcers.  No evidence of eczema currently HEENT: Atraumatic, normocephalic, no obvious bleeding Lungs: Clear to auscultation bilaterally CVS: Regular rate and rhythm, no murmur GI/Abd soft, nontender, nondistended, bowel sound present CNS: Alert, awake, oriented x 3 Psychiatry: Mood appropriate Extremities: No pedal edema, no calf tenderness  Follow ups:    Discharge Instructions:   Discharge Instructions     Call MD for:  difficulty breathing,  headache or visual disturbances   Complete by: As directed    Call MD for:  extreme fatigue   Complete by: As directed    Call MD for:  hives   Complete by: As directed    Call MD for:  persistant dizziness or light-headedness   Complete by: As directed    Call MD for:  persistant nausea and vomiting   Complete by: As directed    Call MD for:  severe uncontrolled pain   Complete by: As directed    Call MD for:  temperature >100.4   Complete by: As directed    Diet general   Complete by: As directed    Discharge instructions   Complete by: As directed    Recommendations at discharge:   Complete the next 2 weeks course of antibiotics  Stop drug abuse.   General discharge instructions: Follow with Primary MD Pcp, No in 7 days  Please request your PCP  to go over your hospital tests, procedures, radiology results at the follow up. Please get your medicines reviewed and adjusted.  Your PCP may decide to repeat certain labs or tests as needed. Do not drive, operate heavy machinery, perform activities at heights, swimming or participation in water activities or provide baby sitting services if your were admitted for syncope or siezures until you have seen by Primary MD or a Neurologist and advised to do so again. [place] Controlled Substance Reporting System database was reviewed. Do not drive, operate heavy machinery, perform activities at heights, swim, participate in water activities or provide baby-sitting services while on medications for pain, sleep and mood until your outpatient physician has reevaluated you and advised to do so again.  You are strongly recommended to comply with the dose, frequency and duration of prescribed medications. Activity: As tolerated with Full fall precautions use walker/cane & assistance as needed Avoid using any recreational substances like cigarette, tobacco, alcohol, or non-prescribed drug. If you experience worsening of your admission symptoms,  develop shortness of breath, life threatening emergency, suicidal or homicidal thoughts you must seek medical attention immediately by calling 911 or calling your MD immediately  if symptoms less severe. You must read complete instructions/literature along with all the possible adverse reactions/side effects for all the medicines you take and that have been prescribed to you. Take any new medicine only after you have completely understood and accepted all the possible adverse reactions/side effects.  Wear Seat belts while driving. You were cared for by a hospitalist during your hospital stay. If you have any questions about your discharge medications or the care you received while you were in the hospital after you are discharged, you can call the unit and ask to speak with the hospitalist or the covering physician. Once you are discharged, your primary care physician will handle any further medical issues. Please note that NO REFILLS for any discharge medications will be authorized once you are discharged, as it  is imperative that you return to your primary care physician (or establish a relationship with a primary care physician if you do not have one).   Increase activity slowly   Complete by: As directed        Discharge Medications:   Allergies as of 06/17/2022   No Known Allergies      Medication List     TAKE these medications    linezolid 600 MG tablet Commonly known as: ZYVOX Take 1 tablet (600 mg total) by mouth 2 (two) times daily for 14 days. Start taking on: Jun 23, 2022   triamcinolone cream 0.1 % Commonly known as: KENALOG Apply 1 Application topically 3 (three) times daily for 7 days. Use it for 7 to 10 days, until symptoms improve. Do not use it beyond 10 days         The results of significant diagnostics from this hospitalization (including imaging, microbiology, ancillary and laboratory) are listed below for reference.    Procedures and Diagnostic Studies:    DG Chest 2 View  Result Date: 06/05/2022 CLINICAL DATA:  cough, fever EXAM: CHEST - 2 VIEW COMPARISON:  None Available. FINDINGS: The heart and mediastinal contours are within normal limits. Vague airspace opacity along the mid right lower lung zone may be related to a nipple shadow. No pulmonary edema. No pleural effusion. No pneumothorax. No acute osseous abnormality. IMPRESSION: Vague airspace opacity along the mid right lower lung zone may be related to a nipple shadow. Recommend repeat frontal view with nipple markers. Electronically Signed   By: Tish Frederickson M.D.   On: 06/05/2022 19:57     Labs:   Basic Metabolic Panel: No results for input(s): "NA", "K", "CL", "CO2", "GLUCOSE", "BUN", "CREATININE", "CALCIUM", "MG", "PHOS" in the last 168 hours. GFR Estimated Creatinine Clearance: 109.1 mL/min (by C-G formula based on SCr of 1.06 mg/dL). Liver Function Tests: No results for input(s): "AST", "ALT", "ALKPHOS", "BILITOT", "PROT", "ALBUMIN" in the last 168 hours. No results for input(s): "LIPASE", "AMYLASE" in the last 168 hours. No results for input(s): "AMMONIA" in the last 168 hours. Coagulation profile No results for input(s): "INR", "PROTIME" in the last 168 hours.  CBC: Recent Labs  Lab 06/11/22 0609  WBC 10.7*  NEUTROABS 5.4  HGB 11.7*  HCT 38.0*  MCV 90.3  PLT 353   Cardiac Enzymes: No results for input(s): "CKTOTAL", "CKMB", "CKMBINDEX", "TROPONINI" in the last 168 hours. BNP: Invalid input(s): "POCBNP" CBG: No results for input(s): "GLUCAP" in the last 168 hours. D-Dimer No results for input(s): "DDIMER" in the last 72 hours. Hgb A1c No results for input(s): "HGBA1C" in the last 72 hours. Lipid Profile No results for input(s): "CHOL", "HDL", "LDLCALC", "TRIG", "CHOLHDL", "LDLDIRECT" in the last 72 hours. Thyroid function studies No results for input(s): "TSH", "T4TOTAL", "T3FREE", "THYROIDAB" in the last 72 hours.  Invalid input(s): "FREET3" Anemia  work up No results for input(s): "VITAMINB12", "FOLATE", "FERRITIN", "TIBC", "IRON", "RETICCTPCT" in the last 72 hours. Microbiology Recent Results (from the past 240 hour(s))  Culture, blood (Routine X 2) w Reflex to ID Panel     Status: None   Collection Time: 06/08/22 10:31 AM   Specimen: Left Antecubital; Blood  Result Value Ref Range Status   Specimen Description LEFT ANTECUBITAL  Final   Special Requests   Final    BOTTLES DRAWN AEROBIC AND ANAEROBIC Blood Culture adequate volume   Culture   Final    NO GROWTH 5 DAYS Performed at [facility],  [address] [place], [place] [address]    Report Status 06/13/2022 FINAL  Final  Culture, blood (Routine X 2) w Reflex to ID Panel     Status: None   Collection Time: 06/08/22 10:32 AM   Specimen: BLOOD RIGHT HAND  Result Value Ref Range Status   Specimen Description BLOOD RIGHT HAND  Final   Special Requests   Final    BOTTLES DRAWN AEROBIC AND ANAEROBIC Blood Culture adequate volume   Culture   Final    NO GROWTH 5 DAYS Performed at [facility], [address] [place], [place] [address]    Report Status 06/13/2022 FINAL  Final    Time coordinating discharge: 45 minutes  Signed: Randale Carvalho  [facility] 06/17/2022, 11:32 AM

## 2022-06-17 NOTE — Plan of Care (Signed)
  Problem: Education: Goal: Knowledge of General Education information will improve Description: Including pain rating scale, medication(s)/side effects and non-pharmacologic comfort measures 06/17/2022 1708 by Tresa Endo, RN Outcome: Adequate for Discharge 06/17/2022 1708 by Tresa Endo, RN Outcome: Adequate for Discharge   Problem: Health Behavior/Discharge Planning: Goal: Ability to manage health-related needs will improve 06/17/2022 1708 by Tresa Endo, RN Outcome: Adequate for Discharge 06/17/2022 1708 by Tresa Endo, RN Outcome: Adequate for Discharge   Problem: Clinical Measurements: Goal: Ability to maintain clinical measurements within normal limits will improve 06/17/2022 1708 by Tresa Endo, RN Outcome: Adequate for Discharge 06/17/2022 1708 by Tresa Endo, RN Outcome: Adequate for Discharge Goal: Will remain free from infection 06/17/2022 1708 by Tresa Endo, RN Outcome: Adequate for Discharge 06/17/2022 1708 by Tresa Endo, RN Outcome: Adequate for Discharge Goal: Diagnostic test results will improve 06/17/2022 1708 by Tresa Endo, RN Outcome: Adequate for Discharge 06/17/2022 1708 by Tresa Endo, RN Outcome: Adequate for Discharge Goal: Respiratory complications will improve 06/17/2022 1708 by Tresa Endo, RN Outcome: Adequate for Discharge 06/17/2022 1708 by Tresa Endo, RN Outcome: Adequate for Discharge Goal: Cardiovascular complication will be avoided 06/17/2022 1708 by Tresa Endo, RN Outcome: Adequate for Discharge 06/17/2022 1708 by Tresa Endo, RN Outcome: Adequate for Discharge   Problem: Activity: Goal: Risk for activity intolerance will decrease 06/17/2022 1708 by Tresa Endo, RN Outcome: Adequate for Discharge 06/17/2022 1708 by Tresa Endo, RN Outcome: Adequate for Discharge   Problem: Nutrition: Goal: Adequate nutrition will be maintained 06/17/2022 1708 by Tresa Endo, RN Outcome: Adequate for Discharge 06/17/2022 1708 by Tresa Endo,  RN Outcome: Adequate for Discharge   Problem: Coping: Goal: Level of anxiety will decrease 06/17/2022 1708 by Tresa Endo, RN Outcome: Adequate for Discharge 06/17/2022 1708 by Tresa Endo, RN Outcome: Adequate for Discharge   Problem: Elimination: Goal: Will not experience complications related to bowel motility 06/17/2022 1708 by Tresa Endo, RN Outcome: Adequate for Discharge 06/17/2022 1708 by Tresa Endo, RN Outcome: Adequate for Discharge Goal: Will not experience complications related to urinary retention 06/17/2022 1708 by Tresa Endo, RN Outcome: Adequate for Discharge 06/17/2022 1708 by Tresa Endo, RN Outcome: Adequate for Discharge   Problem: Safety: Goal: Ability to remain free from injury will improve 06/17/2022 1708 by Tresa Endo, RN Outcome: Adequate for Discharge 06/17/2022 1708 by Tresa Endo, RN Outcome: Adequate for Discharge   Problem: Skin Integrity: Goal: Risk for impaired skin integrity will decrease 06/17/2022 1708 by Tresa Endo, RN Outcome: Adequate for Discharge 06/17/2022 1708 by Tresa Endo, RN Outcome: Adequate for Discharge

## 2022-06-18 ENCOUNTER — Other Ambulatory Visit (HOSPITAL_COMMUNITY): Payer: Self-pay

## 2022-07-01 ENCOUNTER — Inpatient Hospital Stay: Payer: Self-pay | Admitting: Internal Medicine

## 2022-07-14 ENCOUNTER — Other Ambulatory Visit: Payer: Self-pay

## 2022-07-14 ENCOUNTER — Emergency Department (HOSPITAL_COMMUNITY)
Admission: EM | Admit: 2022-07-14 | Discharge: 2022-07-14 | Disposition: A | Discharge status: Home / Self Care | Payer: BC Managed Care – PPO | Attending: Emergency Medicine | Admitting: Emergency Medicine

## 2022-07-14 ENCOUNTER — Encounter (HOSPITAL_COMMUNITY): Payer: Self-pay

## 2022-07-14 DIAGNOSIS — L02213 Cutaneous abscess of chest wall: Secondary | ICD-10-CM | POA: Diagnosis present

## 2022-07-14 DIAGNOSIS — R21 Rash and other nonspecific skin eruption: Secondary | ICD-10-CM | POA: Insufficient documentation

## 2022-07-14 DIAGNOSIS — L309 Dermatitis, unspecified: Secondary | ICD-10-CM | POA: Insufficient documentation

## 2022-07-14 DIAGNOSIS — L02416 Cutaneous abscess of left lower limb: Secondary | ICD-10-CM | POA: Diagnosis not present

## 2022-07-14 DIAGNOSIS — L0291 Cutaneous abscess, unspecified: Secondary | ICD-10-CM

## 2022-07-14 MED ORDER — CEPHALEXIN 500 MG PO CAPS
500.0000 mg | ORAL_CAPSULE | Freq: Four times a day (QID) | ORAL | 0 refills | Status: AC
  Filled 2022-07-14 – 2022-07-15 (×2): qty 20, 5d supply, fill #0

## 2022-07-14 MED ORDER — LIDOCAINE-EPINEPHRINE (PF) 2 %-1:200000 IJ SOLN
10.0000 mL | Freq: Once | INTRAMUSCULAR | Status: AC
  Administered 2022-07-14: 10 mL
  Filled 2022-07-14: qty 20

## 2022-07-14 MED ORDER — CEPHALEXIN 250 MG PO CAPS
500.0000 mg | ORAL_CAPSULE | Freq: Once | ORAL | Status: AC
  Administered 2022-07-14: 500 mg via ORAL
  Filled 2022-07-14: qty 2

## 2022-07-14 MED ORDER — TRIAMCINOLONE ACETONIDE 0.1 % EX CREA
1.0000 | TOPICAL_CREAM | Freq: Two times a day (BID) | CUTANEOUS | 0 refills | Status: DC
  Filled 2022-07-14 – 2022-07-15 (×2): qty 30, 15d supply, fill #0

## 2022-07-14 NOTE — ED Triage Notes (Signed)
Pt presents w/ knot on back just below L shoulder blade x1 day.  Pt believes he was bit by a bug.  Area looks like an abscess.     Pt reports his "cream" was thrown away at the [facility] and he needs it replaced.

## 2022-07-14 NOTE — ED Provider Notes (Signed)
[place] EMERGENCY DEPARTMENT AT [facility] Provider Note   CSN: [id number] Arrival date & time: 07/14/22  1516     History  Chief Complaint  Patient presents with   Abscess    Billy Ward is a 42 y.o. male with past medical history significant for eczema presents to the ED complaining of "knot" on his left back/rib cage just below his shoulder blade.  Patient states he noticed it yesterday and the area is very tender.  He also is in need of his steroid cream that was thrown away at the [facility].  Denies fever, chills.        Home Medications Prior to Admission medications   Medication Sig Start Date End Date Taking? Authorizing Provider  cephALEXin (KEFLEX) 500 MG capsule Take 1 capsule (500 mg total) by mouth 4 (four) times daily. 07/14/22  Yes Clearnce Leja R, PA-C  triamcinolone cream (KENALOG) 0.1 % Apply 1 Application topically 2 (two) times daily. 07/14/22  Yes Nana Hoselton R, PA-C      Allergies    Patient has no known allergies.    Review of Systems   Review of Systems  Constitutional:  Negative for chills and fever.  Skin:  Positive for rash (eczematous).       "Knot" on skin, left side    Physical Exam Updated Vital Signs BP 128/73 (BP Location: Right Arm)   Pulse 86   Temp 99.1 F (37.3 C) (Oral)   Resp 18   SpO2 96%  Physical Exam Vitals and nursing note reviewed.  Constitutional:      General: He is not in acute distress.    Appearance: Normal appearance. He is not ill-appearing or diaphoretic.  Cardiovascular:     Rate and Rhythm: Normal rate and regular rhythm.  Pulmonary:     Effort: Pulmonary effort is normal.  Skin:    General: Skin is warm and dry.     Capillary Refill: Capillary refill takes less than 2 seconds.     Findings: Rash present. Rash is scaling.          Comments: Scaling, eczematous rash to bilateral upper extremities.    Neurological:     Mental Status: He is alert. Mental status is at baseline.  Psychiatric:         Mood and Affect: Mood normal.        Behavior: Behavior normal.     ED Results / Procedures / Treatments   Labs (all labs ordered are listed, but only abnormal results are displayed) Labs Reviewed - No data to display  EKG None  Radiology No results found.  Procedures .Marland KitchenIncision and Drainage  Date/Time: 07/14/2022 8:28 PM  Performed by: Lenard Simmer, PA-C Authorized by: Lenard Simmer, PA-C   Consent:    Consent obtained:  Verbal   Consent given by:  Patient   Risks, benefits, and alternatives were discussed: yes     Risks discussed:  Bleeding, incomplete drainage and pain   Alternatives discussed:  No treatment Universal protocol:    Procedure explained and questions answered to patient or proxy's satisfaction: yes     Imaging studies available: yes     Patient identity confirmed:  Arm band and verbally with patient Location:    Type:  Abscess   Size:  1 cm   Location:  Trunk   Trunk location:  Chest Pre-procedure details:    Skin preparation:  Chlorhexidine with alcohol Sedation:    Sedation type:  None  Anesthesia:    Anesthesia method:  Local infiltration   Local anesthetic:  Lidocaine 2% WITH epi Procedure type:    Complexity:  Simple Procedure details:    Ultrasound guidance: yes     Needle aspiration: no     Incision types:  Stab incision   Wound management:  Probed and deloculated   Drainage:  Bloody and purulent   Drainage amount:  Scant   Wound treatment:  Wound left open   Packing materials:  None Post-procedure details:    Procedure completion:  Tolerated well, no immediate complications .Marland KitchenIncision and Drainage  Date/Time: 07/14/2022 8:29 PM  Performed by: Lenard Simmer, PA-C Authorized by: Lenard Simmer, PA-C   Consent:    Consent obtained:  Verbal   Consent given by:  Patient   Risks, benefits, and alternatives were discussed: yes     Risks discussed:  Bleeding, incomplete drainage and pain   Alternatives discussed:  No  treatment Universal protocol:    Procedure explained and questions answered to patient or proxy's satisfaction: yes     Patient identity confirmed:  Verbally with patient and arm band Location:    Type:  Abscess   Size:  1 cm   Location:  Upper extremity   Upper extremity location:  Arm   Arm location:  L lower arm Pre-procedure details:    Skin preparation:  Chlorhexidine with alcohol Sedation:    Sedation type:  None Anesthesia:    Anesthesia method:  None Procedure type:    Complexity:  Simple Procedure details:    Ultrasound guidance: no     Needle aspiration: yes     Needle size:  18 G   Incision types:  Stab incision   Incision depth:  Dermal   Drainage:  Purulent and bloody   Drainage amount:  Scant   Wound treatment:  Wound left open   Packing materials:  None Post-procedure details:    Procedure completion:  Tolerated well, no immediate complications     Medications Ordered in ED Medications  cephALEXin (KEFLEX) capsule 500 mg (has no administration in time range)  lidocaine-EPINEPHrine (XYLOCAINE W/EPI) 2 %-1:200000 (PF) injection 10 mL (10 mLs Infiltration Given 07/14/22 1907)    ED Course/ Medical Decision Making/ A&P                             Medical Decision Making  This patient presents to the ED with chief complaint(s) of abscess on left side and left arm with pertinent past medical history of eczema.  The complaint involves an extensive differential diagnosis and also carries with it a high risk of complications and morbidity.    The differential diagnosis includes abscess, cellulitis, muscle spasm   Initial Assessment:   Exam significant for a small 1 cm fluctuant mass to the left posterolateral chest wall with surrounding induration.  Area is tender to palpation.  Bedside ultrasound reveals small abscess.  Small, 1 cm abscess to left forearm without active drainage.  There is eczematous rash to bilateral upper extremities.    Treatment and  Reassessment: Patient's abscesses were open and drained.  See procedure section for more detail.  Patient tolerated procedure without immediate complications.   Disposition:   Will treat surrounding cellulitis with cephalexin given patient was recently hospitalized for cellulitis and sepsis.  Triamcinolone also sent to pharmacy to treat eczema.  Will have patient follow-up with [facility].  The patient has been appropriately medically screened and/or stabilized in the ED. I have low suspicion for any other emergent medical condition which would require further screening, evaluation or treatment in the ED or require inpatient management. At time of discharge the patient is hemodynamically stable and in no acute distress. I have discussed work-up results and diagnosis with patient and answered all questions. Patient is agreeable with discharge plan. We discussed strict return precautions for returning to the emergency department and they verbalized understanding.            Final Clinical Impression(s) / ED Diagnoses Final diagnoses:  Abscess  Eczema, unspecified type    Rx / DC Orders ED Discharge Orders          Ordered    triamcinolone cream (KENALOG) 0.1 %  2 times daily        07/14/22 1957    cephALEXin (KEFLEX) 500 MG capsule  4 times daily        07/14/22 1957              Barrie Lyme 07/14/22 2031    Rondel Baton, MD 07/15/22 1049

## 2022-07-14 NOTE — ED Notes (Signed)
Patient verbalizes understanding of discharge instructions. Opportunity for questioning and answers were provided. Armband removed by staff, pt discharged from ED. Pt ambulatory to ED waiting room with steady gait.  

## 2022-07-14 NOTE — Discharge Instructions (Addendum)
Thank you for allowing me to be a part of your care today.   You were seen in the ED for abscesses to your left side and left arm.  These abscesses were opened and drained.  I am sending you home on an antibiotic to treat the surrounding skin infection.  You will need to return to the transitions of care pharmacy tomorrow to pick this prescription up.  You received your first dose while in the ED.  Please take this antibiotic for the entire 5 days.    I have also sent over steroid cream for your eczema to the pharmacy.    I recommend following up at the [facility] and [facility] in the next few days.    Return to the ED if you develop sudden worsening of your symptoms or if you have any new concerns.

## 2022-07-15 ENCOUNTER — Other Ambulatory Visit (HOSPITAL_COMMUNITY): Payer: Self-pay

## 2023-02-09 NOTE — Congregational Nurse Program (Signed)
    Dept: [phone]   Congregational Nurse Program Note  Date of Encounter: 02/09/2023  Past Medical History: No past medical history on file.  Encounter Details:  Community Questionnaire - 02/09/23 1234       Questionnaire   Ask client: Do you give verbal consent for me to treat you today? Yes    Student Assistance N/A    Location Patient Served  [facility]    Encounter Setting CN site    Population Status Unhoused    Insurance Medicaid    Insurance/Financial Assistance Referral N/A    Medication N/A    Medical Provider No    Screening Referrals Made N/A    Medical Referrals Made [facility] PCP/Clinic    Medical Appointment Completed N/A    CNP Interventions Advocate/Support;Navigate Healthcare System;Case Management;Counsel;Educate    Screenings CN Performed N/A    ED Visit Averted N/A    Life-Saving Intervention Made N/A           Client came for assistance for getting a PCP, vision care and dental provider established. We were able to get appointment on April 19, 2023 at 8:50 am at [facility] medicine. He will come back for bus passes. RN referred client to Walk in clinic on [address] [facility] for exam. We also navigated Medicaid website to have find dental provider. Requesting an ointment and referred to [facility].

## 2023-02-15 NOTE — Congregational Nurse Program (Signed)
  Dept: [phone]   Congregational Nurse Program Note  Date of Encounter: 02/15/2023  Past Medical History: No past medical history on file.  Encounter Details:  Community Questionnaire - 02/15/23 0859       Questionnaire   Ask client: Do you give verbal consent for me to treat you today? Yes    Student Assistance N/A    Location Patient Served  [facility]    Encounter Setting CN site    Population Status Unhoused    Insurance Medicaid    Insurance/Financial Assistance Referral N/A    Medication N/A    Medical Provider No   Appointment made   Screening Referrals Made N/A    Medical Referrals Made 1st time PCP connection;Dental    Medical Appointment Completed N/A    CNP Interventions Advocate/Support;Navigate Healthcare System;Case Management;Counsel;Educate    Screenings CN Performed N/A    ED Visit Averted N/A    Life-Saving Intervention Made N/A            Client came to see RN for help getting a dental appointment. RN was able to assist client. He has an appointment on May 1st, 2025 at [facility] at 3:30 pm. Client has no other needs at this time.

## 2023-03-31 NOTE — Congregational Nurse Program (Signed)
 Client requesting an appointment reminder for his dentist appointment on May 1st at 3:30 pm. RN able to give reminder.

## 2023-04-12 ENCOUNTER — Telehealth (INDEPENDENT_AMBULATORY_CARE_PROVIDER_SITE_OTHER): Payer: Self-pay | Admitting: Primary Care

## 2023-04-12 NOTE — Telephone Encounter (Signed)
 Left VM with pt about their upcoming appt.

## 2023-04-19 ENCOUNTER — Ambulatory Visit (INDEPENDENT_AMBULATORY_CARE_PROVIDER_SITE_OTHER): Payer: BC Managed Care – PPO | Admitting: Primary Care

## 2023-04-19 DIAGNOSIS — R7309 Other abnormal glucose: Secondary | ICD-10-CM

## 2023-04-19 DIAGNOSIS — Z1159 Encounter for screening for other viral diseases: Secondary | ICD-10-CM

## 2023-06-01 NOTE — Congregational Nurse Program (Signed)
 Client to RN office. He informed RN that he completed dental appointment and is requesting to go to eye doctor bus passes given. No further concerns at this time.

## 2023-09-01 ENCOUNTER — Encounter (HOSPITAL_COMMUNITY): Payer: Self-pay | Admitting: Emergency Medicine

## 2023-09-01 ENCOUNTER — Other Ambulatory Visit (HOSPITAL_COMMUNITY): Payer: Self-pay

## 2023-09-01 ENCOUNTER — Emergency Department (HOSPITAL_COMMUNITY): Admission: EM | Admit: 2023-09-01 | Discharge: 2023-09-01 | Disposition: A | Discharge status: Home / Self Care

## 2023-09-01 DIAGNOSIS — L309 Dermatitis, unspecified: Secondary | ICD-10-CM | POA: Insufficient documentation

## 2023-09-01 DIAGNOSIS — R21 Rash and other nonspecific skin eruption: Secondary | ICD-10-CM | POA: Diagnosis present

## 2023-09-01 MED ORDER — TRIAMCINOLONE ACETONIDE 0.1 % EX CREA: 1.0000 | TOPICAL_CREAM | Freq: Two times a day (BID) | CUTANEOUS | 0 refills | Status: DC

## 2023-09-01 NOTE — ED Triage Notes (Signed)
 Pt here from home requesting a refill on his Kenalog  cream for his eczema

## 2023-09-01 NOTE — ED Provider Notes (Signed)
  [place] EMERGENCY DEPARTMENT AT [facility] Provider Note   CSN: [id number] Arrival date & time: 09/01/23  1204     Patient presents with: No chief complaint on file.   Billy Ward is a 43 y.o. male.   43 year old male with past medical history of eczema presenting to the emergency department today requesting a refill on this.  The patient states that he has been having an itchy rash now over the past few weeks.  Reports that he did change soaps but is going to switch back to [employer].  The patient states that his symptoms have improved in the past with triamcinolone  but he ran out.  He is requesting this today.  Denies any associated fevers or other systemic symptoms.        Prior to Admission medications   Medication Sig Start Date End Date Taking? Authorizing Provider  cephALEXin  (KEFLEX ) 500 MG capsule Take 1 capsule (500 mg total) by mouth 4 (four) times daily. 07/14/22   Clark, Meghan R, PA-C  triamcinolone  cream (KENALOG ) 0.1 % Apply 1 Application topically 2 (two) times daily. 09/01/23   Ula Prentice SAUNDERS, MD    Allergies: Patient has no known allergies.    Review of Systems  Skin:  Positive for rash.  All other systems reviewed and are negative.   Updated Vital Signs BP 108/75 (BP Location: Right Arm)   Pulse 96   Temp 98.1 F (36.7 C)   Resp 17   SpO2 100%   Physical Exam Vitals and nursing note reviewed.   General: No acute distress Skin: Patient does have scaly appearing rash over the bilateral flexor surfaces of his elbows with no overlying erythema noted  (all labs ordered are listed, but only abnormal results are displayed) Labs Reviewed - No data to display  EKG: None  Radiology: No results found.   Procedures   Medications Ordered in the ED - No data to display                                  Medical Decision Making 43 year old male presenting to the emergency department today with symptoms consistent with eczema.   Will refill his triamcinolone  cream.  Patient has encouraged to start back with the Dove unscented soap and using unscented moisturizer creams.  He is discharged with return precautions.        Final diagnoses:  Eczema, unspecified type    ED Discharge Orders          Ordered    triamcinolone  cream (KENALOG ) 0.1 %  2 times daily        09/01/23 1234               Ula Prentice SAUNDERS, MD 09/01/23 1235

## 2023-09-01 NOTE — Discharge Instructions (Addendum)
 Please start using the triamcinolone  cream and follow-up with your doctor for reevaluation.  Return to the ER for worsening symptoms.

## 2023-11-11 ENCOUNTER — Telehealth: Admitting: Nurse Practitioner

## 2023-11-11 DIAGNOSIS — L309 Dermatitis, unspecified: Secondary | ICD-10-CM

## 2023-11-11 MED ORDER — TRIAMCINOLONE ACETONIDE 0.1 % EX CREA
1.0000 | TOPICAL_CREAM | Freq: Two times a day (BID) | CUTANEOUS | 1 refills | Status: DC
Start: 2023-11-11 — End: 2023-12-07

## 2023-11-11 NOTE — Progress Notes (Signed)
 Acute Video Visit    Virtual Visit Consent:   Alf Doyle, you are scheduled for a virtual visit with a [facility] provider today.     Just as with appointments in the office, your consent must be obtained to participate.  Your consent will be active for this visit and any virtual visit you may have with one of our providers in the next 365 days.     If you have a MyChart account, a copy of this consent can be sent to you electronically.  All virtual visits are billed to your insurance company just like a traditional visit in the office.    If the connection with a video visit is poor, the visit may have to be switched to a telephone visit.  With either a video or telephone visit, we are not always able to ensure that we have a secure connection.     I need to obtain your verbal consent now.   Are you willing to proceed with your visit today?    Jam-es Catterton has provided verbal consent on 11/11/2023 for a virtual visit (video or telephone).   Lauraine Kitty, FNP  Date: 11/11/2023 12:40 PM  Subjective:     Patient ID: Billy Ward, male    DOB: Nov 04, 1980, 43 y.o.   MRN: [id number]  LILLETTE Lauraine Kitty, connected with  Gabino Hagin  ([id number], 1980-02-12) on 11/11/23 at 12:40 PM EDT by a video-enabled telemedicine application and verified that I am speaking with the correct person using two identifiers.   Location: Patient: [facility]  Provider: Virtual Visit Location Provider: Home Office   I discussed the limitations of evaluation and management by telemedicine and the availability of in person appointments. The patient expressed understanding and agreed to proceed.    Presenting with the assistance of the CCN [facility] at the [facility]   He gets his medical needs and SDOH needs met at the [facility]    HPI Jam-es Wilensky is a 43 y.o. who identifies as a male who was assigned male at birth, and is being seen today for assistance with eczema, He has used triamcinolone  in the past and is  requesting a refill.        Objective:      Physical Exam Constitutional:      General: He is not in acute distress.    Appearance: Normal appearance. He is not ill-appearing.  HENT:     Nose: Nose normal.     Mouth/Throat:     Mouth: Mucous membranes are moist.  Pulmonary:     Effort: Pulmonary effort is normal.  Musculoskeletal:     Cervical back: Neck supple.  Skin:    Findings: Rash present.  Neurological:     Mental Status: He is alert and oriented to person, place, and time.  Psychiatric:        Mood and Affect: Mood normal.         Assessment & Plan:   1. Eczema, unspecified type (Primary)   Meds ordered this encounter  Medications   triamcinolone  cream (KENALOG ) 0.1 %    Sig: Apply 1 Application topically 2 (two) times daily. Dispense jar please    Dispense:  453.6 g    Refill:  1    Follow Up Instructions: I discussed the assessment and treatment plan with the patient. The patient was provided an opportunity to ask questions and all were answered. The patient agreed with the plan and demonstrated an understanding of the instructions.  A copy of instructions were sent to the patient via MyChart unless otherwise noted below.     The patient was advised to call back or seek an in-person evaluation if the symptoms worsen or if the condition fails to improve as anticipated.    Lauraine Kitty, FNP  **Disclaimer: This note may have been dictated with voice recognition software. Similar sounding words can inadvertently be transcribed and this note may contain transcription errors which may not have been corrected upon publication of note.**

## 2023-11-11 NOTE — Congregational Nurse Program (Signed)
  Dept: [phone]   Congregational Nurse Program Note  Date of Encounter: 11/11/2023  Past Medical History: No past medical history on file.  Encounter Details:  Community Questionnaire - 11/11/23 1237       Questionnaire   Ask client: Do you give verbal consent for me to treat you today? Yes    Student Assistance N/A    Location Patient Served  [facility]    Encounter Setting CN site    Population Status Unhoused    Insurance Medicaid    Insurance/Financial Assistance Referral N/A    Medication N/A    Medical Provider No   Appointment made   Screening Referrals Made N/A    Medical Referrals Made [facility] Virtual Visit;Dental    Medical Appointment Completed Dental;[facility] Virtual Visit    CNP Interventions Advocate/Support;Navigate Healthcare System;Counsel;Educate;Case Management    Screenings CN Performed N/A    ED Visit Averted N/A    Life-Saving Intervention Made N/A         Client to RN office for ezcema. States that he needs medication for skin. Client doesn't have PCP and RN placed referral to virtual PCP. Appointment completed and medication ordered. Bus passes given to client to pick up medication. No further needs at this time.

## 2023-12-06 NOTE — Congregational Nurse Program (Signed)
  Dept: [phone]   Congregational Nurse Program Note  Date of Encounter: 12/06/2023  Past Medical History: No past medical history on file.  Encounter Details:  Community Questionnaire - 12/06/23 1145       Questionnaire   Ask client: Do you give verbal consent for me to treat you today? Yes    Student Assistance N/A    Location Patient Served  [facility]    Encounter Setting CN site    Population Status Unhoused    Insurance Medicaid    Insurance/Financial Assistance Referral N/A    Medication Have Medication Insecurities;Provided Medication Assistance    Medical Provider Yes   Appointment made   Screening Referrals Made N/A    Medical Referrals Made N/A    Medical Appointment Completed N/A    CNP Interventions Advocate/Support;Navigate Healthcare System;Counsel;Educate;Case Management    Screenings CN Performed N/A    ED Visit Averted N/A    Life-Saving Intervention Made N/A          Client requesting eczema refill from Lauraine Kitty his PCP. RN sent message to PCP to request refill on clients behalf. RN will continue to follow and will update client with response. Mitzie Breen RN, BSN

## 2023-12-07 ENCOUNTER — Other Ambulatory Visit: Payer: Self-pay | Admitting: Nurse Practitioner

## 2023-12-07 ENCOUNTER — Other Ambulatory Visit: Payer: Self-pay

## 2023-12-07 DIAGNOSIS — L309 Dermatitis, unspecified: Secondary | ICD-10-CM

## 2023-12-07 MED ORDER — TRIAMCINOLONE ACETONIDE 0.1 % EX CREA
1.0000 | TOPICAL_CREAM | Freq: Two times a day (BID) | CUTANEOUS | 1 refills | Status: DC
  Filled 2023-12-07: qty 454, 30d supply, fill #0

## 2023-12-07 NOTE — Congregational Nurse Program (Signed)
  Dept: [phone]   Congregational Nurse Program Note  Date of Encounter: 12/07/2023  Past Medical History: No past medical history on file.  Encounter Details:  Community Questionnaire - 12/07/23 1500       Questionnaire   Ask client: Do you give verbal consent for me to treat you today? Yes    Student Assistance N/A    Location Patient Served  [facility]    Encounter Setting CN site    Population Status Unhoused    Insurance Medicaid    Insurance/Financial Assistance Referral N/A    Medication Have Medication Insecurities;Provided Medication Assistance    Medical Provider Yes    Screening Referrals Made N/A    Medical Referrals Made N/A    Medical Appointment Completed N/A    CNP Interventions Navigate Healthcare System;Advocate/Support    Screenings CN Performed N/A    ED Visit Averted N/A    Life-Saving Intervention Made N/A          Triamcinolone  cream RX picked up for client from [employer] and delivered to patient here at [facility].

## 2023-12-27 ENCOUNTER — Other Ambulatory Visit: Payer: Self-pay | Admitting: Nurse Practitioner

## 2023-12-27 ENCOUNTER — Other Ambulatory Visit: Payer: Self-pay

## 2023-12-27 DIAGNOSIS — L309 Dermatitis, unspecified: Secondary | ICD-10-CM

## 2023-12-27 MED ORDER — TRIAMCINOLONE ACETONIDE 0.1 % EX CREA
1.0000 | TOPICAL_CREAM | Freq: Two times a day (BID) | CUTANEOUS | 1 refills | Status: AC
  Filled 2023-12-27: qty 454, 227d supply, fill #0
  Filled 2024-01-06: qty 454, 34d supply, fill #0
  Filled 2024-02-07: qty 454, 34d supply, fill #1

## 2023-12-27 NOTE — Congregational Nurse Program (Signed)
    Dept: [phone]   Congregational Nurse Program Note  Date of Encounter: 12/27/2023  Past Medical History: No past medical history on file.  Encounter Details:  Community Questionnaire - 12/27/23 1215       Questionnaire   Ask client: Do you give verbal consent for me to treat you today? Yes    Student Assistance N/A    Location Patient Served  [facility]    Encounter Setting CN site    Population Status Unhoused    Insurance Medicaid    Insurance/Financial Assistance Referral N/A    Medication Have Medication Insecurities;Provided Medication Assistance    Medical Provider Yes    Screening Referrals Made N/A    Medical Referrals Made N/A    Medical Appointment Completed N/A    CNP Interventions Navigate Healthcare System;Advocate/Support    Screenings CN Performed N/A    ED Visit Averted N/A    Life-Saving Intervention Made N/A         Client presented to office requesting triamcinolone  cream refill. Refill request sent to Lauraine Kitty NP. Prescription called in to [facility] pharmacy and client notified.

## 2024-01-06 ENCOUNTER — Other Ambulatory Visit: Payer: Self-pay

## 2024-01-10 ENCOUNTER — Other Ambulatory Visit: Payer: Self-pay

## 2024-01-31 NOTE — Progress Notes (Signed)
 The patient attended a virtual primary care appt on 11/11/2023. His bp, A1c, and glucose were not conducted due to this being a video visit. During the appt, he did not report smoking status, he has insurance, is not established with a primary care provider, and no SDOH needs indicated at this time.   A chart review indicates Ronal Jenkins Houseman, NP as his PCP. He has no [facility] visible appts with his PCP. And has no future appts indicated at this time. Pt has Medicaid for his insurance according to chart review. Chart review indicates he has a housing, transportation, utilities SDOH need, and history of smoking. Pt is being supported by [employer] Nurse Program over at [facility]. He was last seen by Mitzie Breen, RN on 12/27/2023.  Call Attempt #1: CHW called pt to obtain PCP status and SDOH needs. CHW left vm for pt due to being unable to reach him.  Call Attempt #2: CHW called pt to obtain PCP status and SDOH needs. Pt could not be reached at this time and a vm was left.  Call Attempt #3: CHW called pt again for final attempt. Unable to reach pt at this time and left another vm.  CHW sent letter to pt with [facility] and [facility] PCP [place], housing, transportation, utilities, [place] [address], and smoking cessation resources in case needed by the pt.   An additional follow up will be done at a later date per the [employer] Teams protocol.

## 2024-02-07 ENCOUNTER — Other Ambulatory Visit: Payer: Self-pay

## 2024-02-08 ENCOUNTER — Other Ambulatory Visit: Payer: Self-pay
